# Patient Record
Sex: Male | Born: 1995 | Race: Black or African American | Hispanic: No | Marital: Single | Smoking: Current every day smoker
Health system: Southern US, Community
[De-identification: ages and names within clinical notes are randomized; demographics above are authoritative.]

---
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
## 2016-02-24 ENCOUNTER — Encounter: Payer: Self-pay | Admitting: Emergency Medicine

## 2016-02-24 ENCOUNTER — Emergency Department
Admission: EM | Admit: 2016-02-24 | Discharge: 2016-02-24 | Disposition: A | Discharge status: Home / Self Care | Payer: Medicaid Other | Attending: Emergency Medicine | Admitting: Emergency Medicine

## 2016-02-24 DIAGNOSIS — Y939 Activity, unspecified: Secondary | ICD-10-CM | POA: Diagnosis not present

## 2016-02-24 DIAGNOSIS — T162XXA Foreign body in left ear, initial encounter: Secondary | ICD-10-CM | POA: Diagnosis present

## 2016-02-24 DIAGNOSIS — X58XXXA Exposure to other specified factors, initial encounter: Secondary | ICD-10-CM | POA: Diagnosis not present

## 2016-02-24 DIAGNOSIS — Y929 Unspecified place or not applicable: Secondary | ICD-10-CM | POA: Diagnosis not present

## 2016-02-24 DIAGNOSIS — F17210 Nicotine dependence, cigarettes, uncomplicated: Secondary | ICD-10-CM | POA: Insufficient documentation

## 2016-02-24 DIAGNOSIS — Z79899 Other long term (current) drug therapy: Secondary | ICD-10-CM | POA: Diagnosis not present

## 2016-02-24 DIAGNOSIS — Y999 Unspecified external cause status: Secondary | ICD-10-CM | POA: Insufficient documentation

## 2016-02-24 MED ORDER — LIDOCAINE HCL (PF) 1 % IJ SOLN
INTRAMUSCULAR | Status: AC
  Filled 2016-02-24: qty 5

## 2016-02-24 NOTE — ED Notes (Signed)
See triage note  States he feels like something is fluttering in left ear  No pain

## 2016-02-24 NOTE — ED Triage Notes (Signed)
Pt states he woke up this morning with feelings of a bug in his left ear.  Pt states he still feels it moving around.

## 2016-02-24 NOTE — ED Provider Notes (Signed)
[facility] Emergency Department Provider Note   ____________________________________________   First MD Initiated Contact with Patient 02/24/16 1304     (approximate)  I have reviewed the triage vital signs and the nursing notes.   HISTORY  Chief Complaint Foreign Body in Ear    HPI Christopher Morgan is a 20 y.o. male patient were just wants a refill no blood in his left ear. Patient states still feel that bug moving around in his ear. Patient is very apprehensive about this complaint. Patient denies any pain with this complaint. No palliative measures for this complaint.   History reviewed. No pertinent past medical history.  There are no active problems to display for this patient.   History reviewed. No pertinent surgical history.  Prior to Admission medications   Medication Sig Start Date End Date Taking? Authorizing Provider  amoxicillin (AMOXIL) 500 MG tablet Take 1 tablet (500 mg total) by mouth 2 (two) times daily. 06/11/15   Charmayne Sheerharles M Beers, PA-C  amoxicillin-clavulanate (AUGMENTIN) 875-125 MG tablet Take 1 tablet by mouth 2 (two) times daily. 06/22/15   Chinita Pesterari B Triplett, FNP  chlorpheniramine-HYDROcodone (TUSSIONEX PENNKINETIC ER) 10-8 MG/5ML SUER Take 5 mLs by mouth 2 (two) times daily. 10/29/15   Irean HongJade J Sung, MD  ibuprofen (ADVIL,MOTRIN) 600 MG tablet Take 1 tablet (600 mg total) by mouth every 8 (eight) hours as needed. 10/29/15   Irean HongJade J Sung, MD  lidocaine (XYLOCAINE) 2 % solution Use as directed 20 mLs in the mouth or throat as needed for mouth pain. 06/11/15   Evangeline Dakinharles M Beers, PA-C    Allergies Review of patient's allergies indicates no known allergies.  History reviewed. No pertinent family history.  Social History Social History  Substance Use Topics  . Smoking status: Current Every Day Smoker    Packs/day: 0.03    Types: Cigarettes  . Smokeless tobacco: Never Used  . Alcohol use No    Review of Systems Constitutional: No  fever/chills Eyes: No visual changes. ENT: No sore throat. Insect in left ear. Cardiovascular: Denies chest pain. Respiratory: Denies shortness of breath. Gastrointestinal: No abdominal pain.  No nausea, no vomiting.  No diarrhea.  No constipation. Genitourinary: Negative for dysuria. Musculoskeletal: Negative for back pain. Skin: Negative for rash. Neurological: Negative for headaches, focal weakness or numbness.    ____________________________________________   PHYSICAL EXAM:  VITAL SIGNS: ED Triage Vitals [02/24/16 1247]  Enc Vitals Group     BP 128/65     Pulse Rate 76     Resp 18     Temp 97.9 F (36.6 C)     Temp Source Oral     SpO2 100 %     Weight 146 lb (66.2 kg)     Height 5\' 8"  (1.727 m)     Head Circumference      Peak Flow      Pain Score 0     Pain Loc      Pain Edu?      Excl. in GC?     Constitutional: Alert and oriented. Well appearing and in no acute distress. Eyes: Conjunctivae are normal. PERRL. EOMI. Head: Atraumatic. Nose: No congestion/rhinnorhea. EAR: Visible mobile insect in left ear. Mouth/Throat: Mucous membranes are moist.  Oropharynx non-erythematous. Neck: No stridor.  No cervical spine tenderness to palpation. Hematological/Lymphatic/Immunilogical: No cervical lymphadenopathy. Cardiovascular: Normal rate, regular rhythm. Grossly normal heart sounds.  Good peripheral circulation. Respiratory: Normal respiratory effort.  No retractions. Lungs CTAB. Gastrointestinal: Soft and nontender.  No distention. No abdominal bruits. No CVA tenderness. Musculoskeletal: No lower extremity tenderness nor edema.  No joint effusions. Neurologic:  Normal speech and language. No gross focal neurologic deficits are appreciated. No gait instability. Skin:  Skin is warm, dry and intact. No rash noted. Psychiatric: Mood and affect are normal. Speech and behavior are normal.  ____________________________________________   LABS (all labs ordered are  listed, but only abnormal results are displayed)  Labs Reviewed - No data to display ____________________________________________  EKG   ____________________________________________  RADIOLOGY   ____________________________________________   PROCEDURES  Procedure(s) performed: None  Procedures  Critical Care performed: No  ____________________________________________   INITIAL IMPRESSION / ASSESSMENT AND PLAN / ED COURSE  Pertinent labs & imaging results that were available during my care of the patient were reviewed by me and considered in my medical decision making (see chart for details).  Insect left ear. Status post instilling 1% lidocaine in the ear insect with without complications. Patient given discharge care instructions.  Clinical Course     ____________________________________________   FINAL CLINICAL IMPRESSION(S) / ED DIAGNOSES  Final diagnoses:  Foreign body in auricle of left ear, initial encounter      NEW MEDICATIONS STARTED DURING THIS VISIT:  New Prescriptions   No medications on file     Note:  This document was prepared using Dragon voice recognition software and may include unintentional dictation errors.    Ronald KJoni ReiningC 02/24/16 1339    Jene Every, MD 02/24/16 [phone]

## 2016-04-08 ENCOUNTER — Emergency Department
Admission: EM | Admit: 2016-04-08 | Discharge: 2016-04-08 | Disposition: A | Discharge status: Home / Self Care | Payer: Medicaid Other | Attending: Emergency Medicine | Admitting: Emergency Medicine

## 2016-04-08 ENCOUNTER — Encounter: Payer: Self-pay | Admitting: Emergency Medicine

## 2016-04-08 DIAGNOSIS — J029 Acute pharyngitis, unspecified: Secondary | ICD-10-CM | POA: Insufficient documentation

## 2016-04-08 DIAGNOSIS — F17210 Nicotine dependence, cigarettes, uncomplicated: Secondary | ICD-10-CM | POA: Diagnosis not present

## 2016-04-08 LAB — POCT RAPID STREP A: Streptococcus, Group A Screen (Direct): NEGATIVE

## 2016-04-08 MED ORDER — AMOXICILLIN 500 MG PO TABS: 500.0000 mg | ORAL_TABLET | Freq: Two times a day (BID) | ORAL | 0 refills | Status: DC

## 2016-04-08 NOTE — ED Provider Notes (Signed)
[facility] Emergency Department Provider Note   ____________________________________________   None    (approximate)  I have reviewed the triage vital signs and the nursing notes.   HISTORY  Chief Complaint Sore Throat    HPI Christopher Morgan is a 20 y.o. male 's with 2 day history of sore throat headache. Patient states past medical history significant for recurrent strep.   History reviewed. No pertinent past medical history.  There are no active problems to display for this patient.   History reviewed. No pertinent surgical history.  Prior to Admission medications   Medication Sig Start Date End Date Taking? Authorizing Provider  amoxicillin (AMOXIL) 500 MG tablet Take 1 tablet (500 mg total) by mouth 2 (two) times daily. 04/08/16   Charmayne Sheerharles M Beers, PA-C  lidocaine (XYLOCAINE) 2 % solution Use as directed 20 mLs in the mouth or throat as needed for mouth pain. 06/11/15   Evangeline Dakinharles M Beers, PA-C    Allergies Review of patient's allergies indicates no known allergies.  History reviewed. No pertinent family history.  Social History Social History  Substance Use Topics  . Smoking status: Current Every Day Smoker    Packs/day: 0.03    Types: Cigarettes  . Smokeless tobacco: Never Used  . Alcohol use No    Review of Systems Constitutional: No fever/chills ENT: Positive sore throat Cardiovascular: Denies chest pain. Respiratory: Denies shortness of breath. Musculoskeletal: Negative for back pain. Skin: Negative for rash. Neurological: Negative for headaches, focal weakness or numbness.  10-point ROS otherwise negative.  ____________________________________________   PHYSICAL EXAM:  VITAL SIGNS: ED Triage Vitals  Enc Vitals Group     BP 04/08/16 1737 127/76     Pulse Rate 04/08/16 1737 96     Resp 04/08/16 1737 16     Temp 04/08/16 1737 98.3 F (36.8 C)     Temp Source 04/08/16 1737 Oral     SpO2 04/08/16 1737 98 %     Weight  04/08/16 1738 145 lb (65.8 kg)     Height --      Head Circumference --      Peak Flow --      Pain Score 04/08/16 1738 2     Pain Loc --      Pain Edu? --      Excl. in GC? --     Constitutional: Alert and oriented. Well appearing and in no acute distress. Nose: No congestion/rhinnorhea. Mouth/Throat: Mucous membranes are moist.  Oropharynx Slightly erythematous. No exudate noted. Neck: No stridor.Positive cervical adenopathy.   Cardiovascular: Normal rate, regular rhythm. Grossly normal heart sounds.  Good peripheral circulation. Respiratory: Normal respiratory effort.  No retractions. Lungs CTAB.Marland Kitchen. Neurologic:  Normal speech and language. No gross focal neurologic deficits are appreciated. No gait instability. Skin:  Skin is warm, dry and intact. No rash noted. Psychiatric: Mood and affect are normal. Speech and behavior are normal.  ____________________________________________   LABS (all labs ordered are listed, but only abnormal results are displayed)  Labs Reviewed  CULTURE, GROUP A STREP [facility])  POCT RAPID STREP A   ____________________________________________  EKG   ____________________________________________  RADIOLOGY   ____________________________________________   PROCEDURES  Procedure(s) performed: None  Procedures  Critical Care performed: No  ____________________________________________   INITIAL IMPRESSION / ASSESSMENT AND PLAN / ED COURSE  Pertinent labs & imaging results that were available during my care of the patient were reviewed by me and considered in my medical decision making (see chart for  details).  Pharyngitis. Rx given for Amoxil 500 mg 3 times a day. Patient follow-up PCP or return to ER with any worsening symptomology.  Clinical Course     ____________________________________________   FINAL CLINICAL IMPRESSION(S) / ED DIAGNOSES  Final diagnoses:  Sore throat      NEW MEDICATIONS STARTED DURING THIS  VISIT:  New Prescriptions   AMOXICILLIN (AMOXIL) 500 MG TABLET    Take 1 tablet (500 mg total) by mouth 2 (two) times daily.     Note:  This document was prepared using Dragon voice recognition software and may include unintentional dictation errors.   Evangeline Dakin, PA-C 04/08/16 1828    Rockne Menghini, MD 04/08/16 1929

## 2016-04-08 NOTE — ED Triage Notes (Signed)
Pt c/o sore throat X 3 days. Also reports spots on tongue

## 2016-04-11 LAB — CULTURE, GROUP A STREP (THRC)

## 2016-06-24 ENCOUNTER — Encounter: Payer: Self-pay | Admitting: Emergency Medicine

## 2016-06-24 ENCOUNTER — Emergency Department
Admission: EM | Admit: 2016-06-24 | Discharge: 2016-06-24 | Disposition: A | Discharge status: Home / Self Care | Payer: Medicaid Other | Attending: Emergency Medicine | Admitting: Emergency Medicine

## 2016-06-24 DIAGNOSIS — F17210 Nicotine dependence, cigarettes, uncomplicated: Secondary | ICD-10-CM | POA: Insufficient documentation

## 2016-06-24 DIAGNOSIS — B9689 Other specified bacterial agents as the cause of diseases classified elsewhere: Secondary | ICD-10-CM

## 2016-06-24 DIAGNOSIS — J988 Other specified respiratory disorders: Secondary | ICD-10-CM

## 2016-06-24 DIAGNOSIS — R05 Cough: Secondary | ICD-10-CM | POA: Diagnosis present

## 2016-06-24 MED ORDER — AZITHROMYCIN 250 MG PO TABS: ORAL_TABLET | ORAL | 0 refills | Status: DC

## 2016-06-24 MED ORDER — PSEUDOEPH-BROMPHEN-DM 30-2-10 MG/5ML PO SYRP: 10.0000 mL | ORAL_SOLUTION | Freq: Four times a day (QID) | ORAL | 0 refills | Status: DC | PRN

## 2016-06-24 NOTE — ED Provider Notes (Signed)
[facility] Emergency Department Provider Note  ____________________________________________  Time seen: Approximately 12:06 PM  I have reviewed the triage vital signs and the nursing notes.   HISTORY  Chief Complaint Cough    HPI Christopher Morgan is a 20 y.o. male who presents emergency department complaining of scratchy throat and cough. Patient states that he started with cold-like symptoms 8-10 days ago. Patient states that he has had a cough in addition to some nasal congestion and sore throat. Patient states that symptoms started to improve and then worsened over the past several days. He is tried Tylenol, Motrin, cough drops with mild relief. 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 Patient denies any fevers or chills, headache, visual changes, neck pain, chest pain, shortness of breath, bowel pain, nausea or vomiting.   History reviewed. No pertinent past medical history.  There are no active problems to display for this patient.   History reviewed. No pertinent surgical history.  Prior to Admission medications   Medication Sig Start Date End Date Taking? Authorizing Provider  azithromycin (ZITHROMAX Z-PAK) 250 MG tablet Take 2 tablets (500 mg) on  Day 1,  followed by 1 tablet (250 mg) once daily on Days 2 through 5. 06/24/16   Christiane HaJonathan D Cuthriell, PA-C  brompheniramine-pseudoephedrine-DM 30-2-10 MG/5ML syrup Take 10 mLs by mouth 4 (four) times daily as needed. 06/24/16   Delorise RoyalsJonathan D Cuthriell, PA-C    Allergies Patient has no known allergies.  No family history on file.  Social History Social History  Substance Use Topics  . Smoking status: Current Every Day Smoker    Packs/day: 0.03    Types: Cigarettes  . Smokeless tobacco: Never Used  . Alcohol use No     Review of Systems  Constitutional: No fever/chills Eyes: No visual changes. No discharge ENT: Positive for scratchy throat Cardiovascular: no chest  pain. Respiratory: Positive for nonproductive cough. No SOB. Gastrointestinal: No abdominal pain.  No nausea, no vomiting.  Musculoskeletal: Negative for musculoskeletal pain. Skin: Negative for rash, abrasions, lacerations, ecchymosis. Neurological: Negative for headaches, focal weakness or numbness. 10-point ROS otherwise negative.  ____________________________________________   PHYSICAL EXAM:  VITAL SIGNS: ED Triage Vitals  Enc Vitals Group     BP 06/24/16 1136 123/72     Pulse Rate 06/24/16 1136 75     Resp 06/24/16 1136 18     Temp 06/24/16 1136 98.4 F (36.9 C)     Temp Source 06/24/16 1136 Oral     SpO2 06/24/16 1136 100 %     Weight 06/24/16 1137 146 lb (66.2 kg)     Height 06/24/16 1137 5\' 7"  (1.702 m)     Head Circumference --      Peak Flow --      Pain Score --      Pain Loc --      Pain Edu? --      Excl. in GC? --      Constitutional: Alert and oriented. Well appearing and in no acute distress. Eyes: Conjunctivae are normal. PERRL. EOMI. Head: Atraumatic. ENT:      Ears: EACs and TMs are unremarkable bilaterally.      Nose: No congestion/rhinnorhea.      Mouth/Throat: Mucous membranes are moist. Oropharynx is mildly erythematous but nonedematous. Uvula is midline. Tonsils are unremarkable bilaterally. Neck: No stridor.   Hematological/Lymphatic/Immunilogical: No cervical lymphadenopathy. Cardiovascular: Normal rate, regular rhythm. Normal S1 and S2.  Good peripheral circulation. Respiratory: Normal respiratory  effort without tachypnea or retractions. Lungs CTAB. Good air entry to the bases with no decreased or absent breath sounds. Musculoskeletal: Full range of motion to all extremities. No gross deformities appreciated. Neurologic:  Normal speech and language. No gross focal neurologic deficits are appreciated.  Skin:  Skin is warm, dry and intact. No rash noted. Psychiatric: Mood and affect are normal. Speech and behavior are normal. Patient exhibits  appropriate insight and judgement.   ____________________________________________   LABS (all labs ordered are listed, but only abnormal results are displayed)  Labs Reviewed - No data to display ____________________________________________  EKG   ____________________________________________  RADIOLOGY   No results found.  ____________________________________________    PROCEDURES  Procedure(s) performed:    Procedures    Medications - No data to display   ____________________________________________   INITIAL IMPRESSION / ASSESSMENT AND PLAN / ED COURSE  Pertinent labs & imaging results that were available during my care of the patient were reviewed by me and considered in my medical decision making (see chart for details).  Review of the [place] CSRS was performed in accordance of the [facility] prior to dispensing any controlled drugs.  Clinical Course     Patient's diagnosis is consistent with Extra respiratory infection. Patient's initial symptoms consistent with a viral respiratory infection but his symptoms started to improve and then worsened consistent with onset of bacterial infection.Exam is reassuring and there is no indication for labs or imaging at this time. As such, patient will be given cough medication and a prescription for Zithromax.. Patient will follow-up with primary care as needed. Patient is given ED precautions to return to the ED for any worsening or new symptoms.     ____________________________________________  FINAL CLINICAL IMPRESSION(S) / ED DIAGNOSES  Final diagnoses:  Bacterial respiratory infection      NEW MEDICATIONS STARTED DURING THIS VISIT:  New Prescriptions   AZITHROMYCIN (ZITHROMAX Z-PAK) 250 MG TABLET    Take 2 tablets (500 mg) on  Day 1,  followed by 1 tablet (250 mg) once daily on Days 2 through 5.   BROMPHENIRAMINE-PSEUDOEPHEDRINE-DM 30-2-10 MG/5ML SYRUP    Take 10 mLs by mouth 4 (four) times daily as needed.         This chart was dictated using voice recognition software/Dragon. Despite best efforts to proofread, errors can occur which can change the meaning. Any change was purely unintentional.    Racheal PatchesJonathan D Cuthriell, PA-C 06/24/16 1215    Governor Rooksebecca Lord, MD 06/24/16 1308

## 2016-06-24 NOTE — ED Triage Notes (Signed)
Presents with hx of cough for about 1 week  States sx's became better  Then cough returned with sl sore throat  Cough is non prod

## 2017-03-19 ENCOUNTER — Encounter: Payer: Self-pay | Admitting: Emergency Medicine

## 2017-03-19 ENCOUNTER — Emergency Department
Admission: EM | Admit: 2017-03-19 | Discharge: 2017-03-19 | Disposition: A | Discharge status: Home / Self Care | Payer: Medicaid Other | Attending: Emergency Medicine | Admitting: Emergency Medicine

## 2017-03-19 DIAGNOSIS — Y999 Unspecified external cause status: Secondary | ICD-10-CM | POA: Insufficient documentation

## 2017-03-19 DIAGNOSIS — F17210 Nicotine dependence, cigarettes, uncomplicated: Secondary | ICD-10-CM | POA: Diagnosis not present

## 2017-03-19 DIAGNOSIS — Y939 Activity, unspecified: Secondary | ICD-10-CM | POA: Diagnosis not present

## 2017-03-19 DIAGNOSIS — Y929 Unspecified place or not applicable: Secondary | ICD-10-CM | POA: Diagnosis not present

## 2017-03-19 DIAGNOSIS — S20211A Contusion of right front wall of thorax, initial encounter: Secondary | ICD-10-CM | POA: Diagnosis not present

## 2017-03-19 DIAGNOSIS — X58XXXA Exposure to other specified factors, initial encounter: Secondary | ICD-10-CM | POA: Insufficient documentation

## 2017-03-19 DIAGNOSIS — S299XXA Unspecified injury of thorax, initial encounter: Secondary | ICD-10-CM | POA: Diagnosis present

## 2017-03-19 MED ORDER — TRAMADOL HCL 50 MG PO TABS: 50.0000 mg | ORAL_TABLET | Freq: Four times a day (QID) | ORAL | 0 refills | Status: DC | PRN

## 2017-03-19 MED ORDER — IBUPROFEN 600 MG PO TABS: 600.0000 mg | ORAL_TABLET | Freq: Three times a day (TID) | ORAL | 0 refills | Status: DC | PRN
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
## 2017-11-15 ENCOUNTER — Other Ambulatory Visit: Payer: Self-pay

## 2017-11-15 ENCOUNTER — Encounter: Payer: Self-pay | Admitting: Physician Assistant

## 2017-11-15 ENCOUNTER — Emergency Department
Admission: EM | Admit: 2017-11-15 | Discharge: 2017-11-15 | Disposition: A | Discharge status: Home / Self Care | Payer: Medicaid Other | Attending: Emergency Medicine | Admitting: Emergency Medicine

## 2017-11-15 DIAGNOSIS — Z711 Person with feared health complaint in whom no diagnosis is made: Secondary | ICD-10-CM

## 2017-11-15 DIAGNOSIS — F17210 Nicotine dependence, cigarettes, uncomplicated: Secondary | ICD-10-CM | POA: Insufficient documentation

## 2017-11-15 DIAGNOSIS — Z202 Contact with and (suspected) exposure to infections with a predominantly sexual mode of transmission: Secondary | ICD-10-CM | POA: Insufficient documentation

## 2017-11-15 DIAGNOSIS — Z113 Encounter for screening for infections with a predominantly sexual mode of transmission: Secondary | ICD-10-CM

## 2017-11-15 LAB — URINALYSIS, COMPLETE (UACMP) WITH MICROSCOPIC
Bacteria, UA: NONE SEEN
Bilirubin Urine: NEGATIVE
Glucose, UA: NEGATIVE mg/dL
Hgb urine dipstick: NEGATIVE
Ketones, ur: NEGATIVE mg/dL
Leukocytes, UA: NEGATIVE
Nitrite: NEGATIVE
Protein, ur: NEGATIVE mg/dL
Specific Gravity, Urine: 1.023 (ref 1.005–1.030)
pH: 6 (ref 5.0–8.0)

## 2017-11-15 LAB — CHLAMYDIA/NGC RT PCR (ARMC ONLY)
Chlamydia Tr: NOT DETECTED
N gonorrhoeae: NOT DETECTED
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
## 2017-11-15 NOTE — ED Provider Notes (Signed)
[facility] Emergency Department Provider Note ____________________________________________  Time seen: 1614  I have reviewed the triage vital signs and the nursing notes.  HISTORY  Chief Complaint  Exposure to STD  HPI Christopher Morgan is a 22 y.o. male presents to the ED requesting STD exposure testing.  Patient denies any current dysuria, retention, penile discharge, penile lesions.  He reports that his male partner of five months, apparently cheated on him and he is concerned for STD exposure. The patient has never been tested for HIV/RPR.  History reviewed. No pertinent past medical history.  There are no active problems to display for this patient.  History reviewed. No pertinent surgical history.  Prior to Admission medications   Medication Sig Start Date End Date Taking? Authorizing Provider  azithromycin (ZITHROMAX Z-PAK) 250 MG tablet Take 2 tablets (500 mg) on  Day 1,  followed by 1 tablet (250 mg) once daily on Days 2 through 5. 06/24/16   Cuthriell, Christiane Ha D, PA-C  brompheniramine-pseudoephedrine-DM 30-2-10 MG/5ML syrup Take 10 mLs by mouth 4 (four) times daily as needed. 06/24/16   Cuthriell, Delorise Royals, PA-C  ibuprofen (ADVIL,MOTRIN) 600 MG tablet Take 1 tablet (600 mg total) by mouth every 8 (eight) hours as needed. 03/19/17   Joni Reining, PA-C  traMADol (ULTRAM) 50 MG tablet Take 1 tablet (50 mg total) by mouth every 6 (six) hours as needed for moderate pain. 03/19/17   Joni Reining, PA-C    Allergies Patient has no known allergies.  No family history on file.  Social History Social History   Tobacco Use  . Smoking status: Current Every Day Smoker    Packs/day: 0.03    Types: Cigarettes  . Smokeless tobacco: Never Used  Substance Use Topics  . Alcohol use: No  . Drug use: No    Review of Systems  Constitutional: Negative for fever. Eyes: Negative for visual changes. ENT: Negative for sore throat. Cardiovascular: Negative for  chest pain. Respiratory: Negative for shortness of breath. Gastrointestinal: Negative for abdominal pain, vomiting and diarrhea. Genitourinary: Negative for dysuria, penile discharge, or lesions. Musculoskeletal: Negative for back pain. Skin: Negative for rash. Neurological: Negative for headaches, focal weakness or numbness. ____________________________________________  PHYSICAL EXAM:  VITAL SIGNS: ED Triage Vitals [11/15/17 1608]  Enc Vitals Group     BP 113/71     Pulse Rate (!) 104     Resp 18     Temp 98.2 F (36.8 C)     Temp Source Oral     SpO2 98 %     Weight 135 lb (61.2 kg)     Height  (1.702 m)     Head Circumference      Peak Flow      Pain Score 0     Pain Loc      Pain Edu?      Excl. in GC?     Constitutional: Alert and oriented. Well appearing and in no distress. Head: Normocephalic and atraumatic. Eyes: Conjunctivae are normal. PERRL. Normal extraocular movements Cardiovascular: Normal rate, regular rhythm. Normal distal pulses. Respiratory: Normal respiratory effort. No wheezes/rales/rhonchi. Gastrointestinal: Soft and nontender. No distention. GU: deferred Musculoskeletal: Nontender with normal range of motion in all extremities.  Neurologic:  Normal gait without ataxia. Normal speech and language. No gross focal neurologic deficits are appreciated. Skin:  Skin is warm, dry and intact. No rash noted. Psychiatric: Mood and affect are normal. Patient exhibits appropriate insight and judgment. ____________________________________________   LABS (  pertinent positives/negatives)  Labs Reviewed  URINALYSIS, COMPLETE (UACMP) WITH MICROSCOPIC - Abnormal; Notable for the following components:      Result Value   Color, Urine YELLOW (*)    APPearance HAZY (*)    All other components within normal limits  CHLAMYDIA/NGC RT PCR ([facility] ONLY)  RPR  HIV ANTIBODY (ROUTINE TESTING)   ____________________________________________  PROCEDURES  Procedures ____________________________________________  INITIAL IMPRESSION / ASSESSMENT AND PLAN / ED COURSE  Patient with a history of MSM, presents for STD exposure testing. He is without symptoms at this time. We discussed the need for repeat testing in 3 months. He is referred to the local health department for ongoing care and medical care. He is notified of negative GC culture. His HIV & RPR tests are pending at this time. Questions are encouraged and answered to his satisfaction.  ____________________________________________  FINAL CLINICAL IMPRESSION(S) / ED DIAGNOSES  Final diagnoses:  Screen for STD (sexually transmitted disease)  Concern about STD in male without diagnosis      Karmen Stabs, Charlesetta Ivory, PA-C 11/15/17 2007    Emily Filbert, MD 11/15/17 2258

## 2017-11-15 NOTE — ED Triage Notes (Signed)
Pt alert, oriented, ambulatory. States significant other cheated on him and he wants to get STD tested. No distress noted. Denies urinary symptoms, denies penile discharge.

## 2017-11-15 NOTE — Discharge Instructions (Addendum)
You test for gonorrhea and chlamydia are negative. You remaining blood test will result in 1 week. You will be notified by phone of any positive results. You should follow-up with the health department for further testing and routine medical care. Consider the HPV vaccine if you have not already received it. There is no indication for treatment of any presumed infection at this time.

## 2017-11-18 LAB — RPR, QUANT+TP ABS (REFLEX)
Rapid Plasma Reagin, Quant: 1:16 {titer} — ABNORMAL HIGH
T Pallidum Abs: POSITIVE — AB

## 2017-11-18 LAB — HIV ANTIBODY (ROUTINE TESTING W REFLEX): HIV Screen 4th Generation wRfx: REACTIVE — AB

## 2017-11-18 LAB — HIV 1/2 AB DIFFERENTIATION
HIV 1 Ab: POSITIVE — AB
HIV 2 Ab: NEGATIVE

## 2017-11-18 LAB — RPR: RPR Ser Ql: REACTIVE — AB

## 2017-11-19 ENCOUNTER — Telehealth: Payer: Self-pay | Admitting: Emergency Medicine

## 2017-11-19 ENCOUNTER — Encounter: Payer: Self-pay | Admitting: Infectious Diseases

## 2017-11-19 NOTE — Telephone Encounter (Signed)
Called patient to discuss results of rpr, hiv testing.  He needs treatment for positive rpr/t palidum--which he can receive at his pcp or urgent care or [facility] STD clinic or here.  He also needs to get into treatment for HIV.  Resources for HIV care options would be his pcp (if he has one,) [facility], [facility].  I was unable to reach the patient by phone, as it does not go through.  The number listed for his mother is wrong number.  So I will send him a letter.

## 2017-11-20 ENCOUNTER — Telehealth: Payer: Self-pay | Admitting: Infectious Diseases

## 2017-11-20 NOTE — Telephone Encounter (Signed)
Spoke with Christopher Morgan with [facility] and she is aware of + HIV and + RPR> I tried calling patient multiple times but number busy. I will try  to  Keep trying and so will Tiara. Will get into see [facility] next week.

## 2018-05-22 ENCOUNTER — Emergency Department
Admission: EM | Admit: 2018-05-22 | Discharge: 2018-05-22 | Disposition: A | Discharge status: Home / Self Care | Payer: Self-pay | Attending: Emergency Medicine | Admitting: Emergency Medicine

## 2018-05-22 ENCOUNTER — Emergency Department: Payer: Self-pay

## 2018-05-22 ENCOUNTER — Other Ambulatory Visit: Payer: Self-pay

## 2018-05-22 DIAGNOSIS — T148XXA Other injury of unspecified body region, initial encounter: Secondary | ICD-10-CM | POA: Insufficient documentation

## 2018-05-22 DIAGNOSIS — Y92481 Parking lot as the place of occurrence of the external cause: Secondary | ICD-10-CM | POA: Insufficient documentation

## 2018-05-22 DIAGNOSIS — Y9389 Activity, other specified: Secondary | ICD-10-CM | POA: Insufficient documentation

## 2018-05-22 DIAGNOSIS — S0990XA Unspecified injury of head, initial encounter: Secondary | ICD-10-CM | POA: Insufficient documentation

## 2018-05-22 DIAGNOSIS — S0232XA Fracture of orbital floor, left side, initial encounter for closed fracture: Secondary | ICD-10-CM | POA: Insufficient documentation

## 2018-05-22 DIAGNOSIS — Y998 Other external cause status: Secondary | ICD-10-CM | POA: Insufficient documentation

## 2018-05-22 DIAGNOSIS — F17210 Nicotine dependence, cigarettes, uncomplicated: Secondary | ICD-10-CM | POA: Insufficient documentation

## 2018-05-22 MED ORDER — CEPHALEXIN 500 MG PO CAPS: 500.0000 mg | ORAL_CAPSULE | Freq: Three times a day (TID) | ORAL | 0 refills | Status: DC

## 2018-05-22 MED ORDER — HYDROCODONE-ACETAMINOPHEN 5-325 MG PO TABS: 1.0000 | ORAL_TABLET | Freq: Four times a day (QID) | ORAL | 0 refills | Status: DC | PRN

## 2018-05-22 NOTE — ED Notes (Signed)
05/22/18 1800 multiple attempts made to contact pt regarding additional prescription left for pt, unable to get through to pt's phone
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
## 2018-05-22 NOTE — ED Notes (Signed)
Pt stated that he has not been drinking alcohol or doing any types of drugs tonight.
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
## 2018-05-22 NOTE — Discharge Instructions (Addendum)
You may take Norco (#15) as needed for pain.   Apply ice to affected area several times daily to reduce swelling.   Return to the ER for worsening symptoms, persistent vomiting, lethargy or other concerns.
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
## 2020-05-16 ENCOUNTER — Other Ambulatory Visit: Payer: Self-pay

## 2020-05-16 ENCOUNTER — Emergency Department
Admission: EM | Admit: 2020-05-16 | Discharge: 2020-05-17 | Disposition: A | Discharge status: Home / Self Care | Payer: Self-pay | Attending: Emergency Medicine | Admitting: Emergency Medicine

## 2020-05-16 DIAGNOSIS — F29 Unspecified psychosis not due to a substance or known physiological condition: Secondary | ICD-10-CM | POA: Diagnosis not present

## 2020-05-16 DIAGNOSIS — F23 Brief psychotic disorder: Secondary | ICD-10-CM

## 2020-05-16 DIAGNOSIS — F17210 Nicotine dependence, cigarettes, uncomplicated: Secondary | ICD-10-CM | POA: Insufficient documentation

## 2020-05-16 DIAGNOSIS — Z21 Asymptomatic human immunodeficiency virus [HIV] infection status: Secondary | ICD-10-CM

## 2020-05-16 DIAGNOSIS — Z20822 Contact with and (suspected) exposure to covid-19: Secondary | ICD-10-CM | POA: Insufficient documentation

## 2020-05-16 LAB — URINE DRUG SCREEN, QUALITATIVE (ARMC ONLY)
Amphetamines, Ur Screen: NOT DETECTED
Barbiturates, Ur Screen: NOT DETECTED
Benzodiazepine, Ur Scrn: POSITIVE — AB
Cannabinoid 50 Ng, Ur ~~LOC~~: POSITIVE — AB
Cocaine Metabolite,Ur ~~LOC~~: NOT DETECTED
MDMA (Ecstasy)Ur Screen: NOT DETECTED
Methadone Scn, Ur: NOT DETECTED
Opiate, Ur Screen: NOT DETECTED
Phencyclidine (PCP) Ur S: NOT DETECTED
Tricyclic, Ur Screen: NOT DETECTED

## 2020-05-16 LAB — COMPREHENSIVE METABOLIC PANEL
ALT: 9 U/L (ref 0–44)
AST: 23 U/L (ref 15–41)
Albumin: 4.1 g/dL (ref 3.5–5.0)
Alkaline Phosphatase: 54 U/L (ref 38–126)
Anion gap: 10 (ref 5–15)
BUN: 14 mg/dL (ref 6–20)
CO2: 24 mmol/L (ref 22–32)
Calcium: 9.3 mg/dL (ref 8.9–10.3)
Chloride: 103 mmol/L (ref 98–111)
Creatinine, Ser: 0.86 mg/dL (ref 0.61–1.24)
GFR, Estimated: >60 mL/min (ref >60)
Glucose, Bld: 93 mg/dL (ref 70–99)
Potassium: 3.8 mmol/L (ref 3.5–5.1)
Sodium: 137 mmol/L (ref 135–145)
Total Bilirubin: 0.9 mg/dL (ref 0.3–1.2)
Total Protein: 7.8 g/dL (ref 6.5–8.1)

## 2020-05-16 LAB — HEPATITIS PANEL, ACUTE
HCV Ab: NONREACTIVE
Hep A IgM: NONREACTIVE
Hep B C IgM: NONREACTIVE
Hepatitis B Surface Ag: NONREACTIVE

## 2020-05-16 LAB — CBC
HCT: 43.9 % (ref 39.0–52.0)
Hemoglobin: 14.9 g/dL (ref 13.0–17.0)
MCH: 29.1 pg (ref 26.0–34.0)
MCHC: 33.9 g/dL (ref 30.0–36.0)
MCV: 85.7 fL (ref 80.0–100.0)
Platelets: 235 10*3/uL (ref 150–400)
RBC: 5.12 MIL/uL (ref 4.22–5.81)
RDW: 11.9 % (ref 11.5–15.5)
WBC: 6.8 10*3/uL (ref 4.0–10.5)
nRBC: 0 % (ref 0.0–0.2)

## 2020-05-16 LAB — TSH: TSH: 0.554 u[IU]/mL (ref 0.350–4.500)

## 2020-05-16 LAB — ETHANOL: Alcohol, Ethyl (B): <10 mg/dL (ref <10)

## 2020-05-16 LAB — RESPIRATORY PANEL BY RT PCR (FLU A&B, COVID)
Influenza A by PCR: NEGATIVE
Influenza B by PCR: NEGATIVE
SARS Coronavirus 2 by RT PCR: NEGATIVE

## 2020-05-16 MED ORDER — DIPHENHYDRAMINE HCL 50 MG/ML IJ SOLN
50.0000 mg | Freq: Once | INTRAMUSCULAR | Status: AC
  Administered 2020-05-16: 50 mg via INTRAMUSCULAR

## 2020-05-16 MED ORDER — DIPHENHYDRAMINE HCL 25 MG PO CAPS
50.0000 mg | ORAL_CAPSULE | Freq: Once | ORAL | Status: AC
  Administered 2020-05-17: 50 mg via ORAL
  Filled 2020-05-16: qty 2

## 2020-05-16 MED ORDER — LORAZEPAM 2 MG PO TABS
2.0000 mg | ORAL_TABLET | Freq: Once | ORAL | Status: AC
  Administered 2020-05-17: 2 mg via ORAL
  Filled 2020-05-16: qty 1

## 2020-05-16 MED ORDER — DIPHENHYDRAMINE HCL 50 MG/ML IJ SOLN
50.0000 mg | Freq: Once | INTRAMUSCULAR | Status: DC
  Filled 2020-05-16: qty 1

## 2020-05-16 MED ORDER — DIPHENHYDRAMINE HCL 50 MG/ML IJ SOLN: 50.0000 mg | Freq: Once | INTRAMUSCULAR | Status: AC

## 2020-05-16 MED ORDER — LORAZEPAM 2 MG/ML IJ SOLN: 2.0000 mg | Freq: Once | INTRAMUSCULAR | Status: AC

## 2020-05-16 MED ORDER — HALOPERIDOL LACTATE 5 MG/ML IJ SOLN
5.0000 mg | Freq: Once | INTRAMUSCULAR | Status: AC
  Administered 2020-05-16: 5 mg via INTRAMUSCULAR
  Filled 2020-05-16: qty 1

## 2020-05-16 MED ORDER — HALOPERIDOL 5 MG PO TABS
5.0000 mg | ORAL_TABLET | Freq: Once | ORAL | Status: AC
  Administered 2020-05-17: 5 mg via ORAL
  Filled 2020-05-16: qty 1

## 2020-05-16 MED ORDER — HALOPERIDOL LACTATE 5 MG/ML IJ SOLN: 5.0000 mg | Freq: Once | INTRAMUSCULAR | Status: AC

## 2020-05-16 MED ORDER — LORAZEPAM 2 MG/ML IJ SOLN
2.0000 mg | Freq: Once | INTRAMUSCULAR | Status: AC
  Administered 2020-05-16: 2 mg via INTRAMUSCULAR
  Filled 2020-05-16: qty 1

## 2020-05-16 NOTE — ED Notes (Signed)
Pt extremely paranoid, not cooperating, yelling.  Believes people are trying to kill him and that he wont wake up tomorrow.  Medications ordered by Dr. Manson Passey and administered.  Unable to complete triage or assessment at this time d/t pt being uncooperative.  Pt chest rise even and unlabored at this time.

## 2020-05-16 NOTE — ED Notes (Signed)
Hourly rounding reveals patient in room. No complaints, stable, in no acute distress. Q15 minute rounds and monitoring via Security Cameras to continue. 

## 2020-05-16 NOTE — ED Notes (Signed)
[facility]  MOVED  TO  [facility]  UNIT   PENDING  CONSULT

## 2020-05-16 NOTE — ED Provider Notes (Signed)
[facility] Emergency Department Provider Note  ____________________________________________   First MD Initiated Contact with Patient 05/16/20 [phone]     (approximate)  I have reviewed the triage vital signs and the nursing notes.  Level 5 caveat history review of system limited secondary to acute psychosis HISTORY  Chief Complaint Mental Health Problem    HPI Christopher Morgan is a 24 y.o. male presents to the emergency department involuntary committed in police custody.  Per involuntary commitment paperwork patient has a history of mental health and has had paranoid delusions.  Patient repetitively stating that people are trying to kill him including emergency department staff members and the police.  Police notified me that the patient's mother stated that he has not slept in "days.  Police states that the patient called 911 to say that people outside of his door were trying to kill him.        No past medical history on file.  There are no problems to display for this patient.   No past surgical history on file.  Prior to Admission medications   Medication Sig Start Date End Date Taking? Authorizing Provider  azithromycin (ZITHROMAX Z-PAK) 250 MG tablet Take 2 tablets (500 mg) on  Day 1,  followed by 1 tablet (250 mg) once daily on Days 2 through 5. 06/24/16   Cuthriell, Christiane Ha D, PA-C  brompheniramine-pseudoephedrine-DM 30-2-10 MG/5ML syrup Take 10 mLs by mouth 4 (four) times daily as needed. 06/24/16   Cuthriell, Delorise Royals, PA-C  cephALEXin (KEFLEX) 500 MG capsule Take 1 capsule (500 mg total) by mouth 3 (three) times daily. 05/22/18   Irean Hong, MD  HYDROcodone-acetaminophen (NORCO) 5-325 MG tablet Take 1 tablet by mouth every 6 (six) hours as needed for moderate pain. 05/22/18   Irean Hong, MD  ibuprofen (ADVIL,MOTRIN) 600 MG tablet Take 1 tablet (600 mg total) by mouth every 8 (eight) hours as needed. 03/19/17   Joni Reining, PA-C  traMADol  (ULTRAM) 50 MG tablet Take 1 tablet (50 mg total) by mouth every 6 (six) hours as needed for moderate pain. 03/19/17   Joni Reining, PA-C    Allergies Patient has no known allergies.  No family history on file.  Social History Social History   Tobacco Use  . Smoking status: Current Every Day Smoker    Packs/day: 0.03    Types: Cigarettes  . Smokeless tobacco: Never Used  Substance Use Topics  . Alcohol use: No  . Drug use: No    Review of Systems Constitutional: No fever/chills Eyes: No visual changes. ENT: No sore throat. Cardiovascular: Denies chest pain. Respiratory: Denies shortness of breath. Gastrointestinal: No abdominal pain.  No nausea, no vomiting.  No diarrhea.  No constipation. Genitourinary: Negative for dysuria. Musculoskeletal: Negative for neck pain.  Negative for back pain. Integumentary: Negative for rash. Neurological: Negative for headaches, focal weakness or numbness. Psychiatry: Paranoid delusions, hallucinations  ____________________________________________   PHYSICAL EXAM:  VITAL SIGNS: ED Triage Vitals [05/16/20 0240]  Enc Vitals Group     BP (!) 154/96     Pulse Rate 62     Resp 18     Temp 98.6 F (37 C)     Temp Source Oral     SpO2 100 %     Weight      Height      Head Circumference      Peak Flow      Pain Score  Pain Loc      Pain Edu?      Excl. in GC?     Constitutional: Agitated pressured speech Eyes: Conjunctivae are normal.  Head: Atraumatic. Mouth/Throat: Patient is wearing a mask. Neck: No stridor.  No meningeal signs.   Cardiovascular: Normal rate, regular rhythm. Good peripheral circulation. Grossly normal heart sounds. Respiratory: Normal respiratory effort.  No retractions. Gastrointestinal: Soft and nontender. No distention.  Musculoskeletal: No lower extremity tenderness nor edema. No gross deformities of extremities. Neurologic: Pressured speech. No gross focal neurologic deficits are appreciated.    Skin:  Skin is warm, dry and intact. Psychiatric: Agitated, pressured speech, paranoid delusions  ____________________________________________   LABS (all labs ordered are listed, but only abnormal results are displayed)  Labs Reviewed  CBC  COMPREHENSIVE METABOLIC PANEL  URINE DRUG SCREEN, QUALITATIVE ([facility] ONLY)  ETHANOL  TSH     Procedures   ____________________________________________   INITIAL IMPRESSION / MDM / ASSESSMENT AND PLAN / ED COURSE  As part of my medical decision making, I reviewed the following data within the electronic MEDICAL RECORD NUMBER  24 year old male presented with above-stated history and physical exam paranoid delusions acute psychosis and agitation.  Given potential risk to staff and patient patient was given Haldol 5 mg IM Benadryl 50 mg IM and Ativan 2 mg IM.  On reevaluation patient resting comfortably with vital signs stable.  Awaiting psychiatry consultation and disposition. ____________________________________________  FINAL CLINICAL IMPRESSION(S) / ED DIAGNOSES  Final diagnoses:  Acute psychosis (HCC)     MEDICATIONS GIVEN DURING THIS VISIT:  Medications  haloperidol lactate (HALDOL) injection 5 mg (5 mg Intramuscular Given by Other 05/16/20 0317)  LORazepam (ATIVAN) injection 2 mg (2 mg Intramuscular Given by Other 05/16/20 0318)  diphenhydrAMINE (BENADRYL) injection 50 mg (50 mg Intramuscular Given by Other 05/16/20 0319)     ED Discharge Orders    None      *Please note:  Christopher Morgan was evaluated in Emergency Department on 05/16/2020 for the symptoms described in the history of present illness. He was evaluated in the context of the global COVID-19 pandemic, which necessitated consideration that the patient might be at risk for infection with the SARS-CoV-2 virus that causes COVID-19. Institutional protocols and algorithms that pertain to the evaluation of patients at risk for COVID-19 are in a state of rapid change based on  information released by regulatory bodies including the CDC and federal and state organizations. These policies and algorithms were followed during the patient's care in the ED.  Some ED evaluations and interventions may be delayed as a result of limited staffing during and after the pandemic.*  Note:  This document was prepared using Dragon voice recognition software and may include unintentional dictation errors.   Darci Current, MD 05/16/20 [phone]

## 2020-05-16 NOTE — ED Notes (Signed)
Pt. Is being dressed out by this tech and 2 officers and 2 security officers. Provided by hospital attire and belongings inside white bag.   Belongings are :   Light blue jeans  Black Jacket  Light blue underwear Black Pair of shoes  White socks  Pink and white flowered tank

## 2020-05-16 NOTE — ED Notes (Signed)
He ambulated to the BR with the specimen cup and he failed to provide a urine sample for the MD

## 2020-05-16 NOTE — ED Notes (Signed)
Lab called for blood tubes, received labs drawn and sent. Urine obtained and sent.

## 2020-05-16 NOTE — ED Notes (Signed)
Snack and beverage given. 

## 2020-05-16 NOTE — Consult Note (Signed)
[facility] Face-to-Face Psychiatry Consult   Reason for Consult: Consult for this 24 year old man brought to the emergency room by law enforcement because of acute paranoia Referring Physician: Scotty Court Patient Identification: Christopher Morgan MRN:  [id number] Principal Diagnosis: Psychosis [facility]) Diagnosis:  Principal Problem:   Psychosis (HCC) Active Problems:   HIV positive (HCC)   Total Time spent with patient: 1 hour  Subjective:   Christopher Morgan is a 24 y.o. Morgan patient admitted with "are you trying to kill me?".  HPI: Patient seen chart reviewed.  Also got to speak briefly with his mother.  50 year old man brought to the emergency room by law enforcement.  Law enforcement reports that the patient himself called 911 stating that people were trying to kill him.  When they got there he was paranoid and resistant towards the law enforcement officers continued to make statements about people being out to get him are trying to kill him.  In the emergency room patient was noted to continue to make paranoid statements and be inappropriate in his thinking when he presented last night.  On interview this morning the patient stated he had no memory of coming to the hospital at all although on further conversation he does have some memory that there were police at his house.  He does not remember the whole chain of events.  He does admit that he has not been sleeping well for a few days.  Denies any mood symptoms.  Denies suicidal or homicidal ideation.  Denies current hallucinations.  He denied any alcohol or drug abuse.  So far we have not been able to get a urine sample so we have no drug screen yet.  Patient is apparently not on any prescription medicine.  He has continued to voice paranoid ideation throughout the day in the emergency room.  Told me several times that he was certain that I was trying to kill him.  He comes across with a an odd, flirtatious labile affect.  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  When I ask him about them he said that he was aware of the positive test but had never gotten any sort of follow-up at all about the HIV.  He says that they gave him "some kind of medicine" for the syphilis but he has no memory of what it was.  Other than that has not had any follow-up on it.  Past Psychiatric History: Mother reports that as a child he had some odd behavior and some school refusal at times.  Patient had no reported memory of it.  No clear diagnosis.  As an adult appears to have had no mental health issues reported  Risk to Self:   Risk to Others:   Prior Inpatient Therapy:   Prior Outpatient Therapy:    Past Medical History: No past medical history on file. No past surgical history on file. Family History: No family history on file. Family Psychiatric  History: None reported Social History:  Social History   Substance and Sexual Activity  Alcohol Use No     Social History   Substance and Sexual Activity  Drug Use No    Social History   Socioeconomic History  . Marital status: Single    Spouse name: Not on file  . Number of children: Not on file  . Years of education: Not on file  . Highest education level: Not on file  Occupational History  . Not on file  Tobacco Use  . Smoking status: Current Every Day Smoker    Packs/day: 0.03    Types: Cigarettes  . Smokeless tobacco: Never Used  Substance and Sexual Activity  . Alcohol use: No  . Drug use: No  . Sexual activity: Not on file  Other Topics Concern  . Not on file  Social History Narrative  . Not on file   Social Determinants of Health   Financial Resource Strain:   . Difficulty of Paying Living Expenses: Not on file  Food Insecurity:   . Worried About Programme researcher, broadcasting/film/videounning Out of Food in the Last Year: Not on file  . Ran Out of Food in the Last Year: Not on file  Transportation Needs:   . Lack of Transportation (Medical): Not  on file  . Lack of Transportation (Non-Medical): Not on file  Physical Activity:   . Days of Exercise per Week: Not on file  . Minutes of Exercise per Session: Not on file  Stress:   . Feeling of Stress : Not on file  Social Connections:   . Frequency of Communication with Friends and Family: Not on file  . Frequency of Social Gatherings with Friends and Family: Not on file  . Attends Religious Services: Not on file  . Active Member of Clubs or Organizations: Not on file  . Attends BankerClub or Organization Meetings: Not on file  . Marital Status: Not on file   Additional Social History:    Allergies:  No Known Allergies  Labs:  Results for orders placed or performed during the hospital encounter of 05/16/20 (from the past 48 hour(s))  CBC     Status: None   Collection Time: 05/16/20  6:11 AM  Result Value Ref Range   WBC 6.8 4.0 - 10.5 K/uL   RBC 5.12 4.22 - 5.81 MIL/uL   Hemoglobin 14.9 13.0 - 17.0 g/dL   HCT 54.043.9 39 - 52 %   MCV 85.7 80.0 - 100.0 fL   MCH 29.1 26.0 - 34.0 pg   MCHC 33.9 30.0 - 36.0 g/dL   RDW 98.111.9 19.111.5 - 47.815.5 %   Platelets 235 150 - 400 K/uL   nRBC 0.0 0.0 - 0.2 %    Comment: Performed at [facility], [address] [place], [place] [id number]  Comprehensive metabolic panel     Status: None   Collection Time: 05/16/20  6:11 AM  Result Value Ref Range   Sodium 137 135 - 145 mmol/L   Potassium 3.8 3.5 - 5.1 mmol/L   Chloride 103 98 - 111 mmol/L   CO2 24 22 - Christopher mmol/L   Glucose, Bld 93 70 - 99 mg/dL    Comment: Glucose reference range applies only to samples taken after fasting for at least 8 hours.   BUN 14 6 - 20 mg/dL   Creatinine, Ser 1.300.86 0.61 - 1.24 mg/dL   Calcium 9.3 8.9 - 86.510.3 mg/dL   Total Protein 7.8 6.5 - 8.1 g/dL   Albumin 4.1 3.5 - 5.0 g/dL   AST 23 15 - 41 U/L   ALT 9 0 - 44 U/L   Alkaline Phosphatase 54 38 - 126 U/L   Total Bilirubin 0.9 0.3 - 1.2 mg/dL   GFR, Estimated >78>60 >46>60 mL/min    Comment: (NOTE) Calculated using  the CKD-EPI Creatinine Equation (2021)    Anion gap 10 5 - 15    Comment: Performed at [facility], [address] [place], [place] [address]  Ethanol     Status: None   Collection Time: 05/16/20  6:11 AM  Result Value Ref Range   Alcohol, Ethyl (B) <10 <10 mg/dL    Comment: (NOTE) Lowest detectable limit for serum alcohol is 10 mg/dL.  For medical purposes only. Performed at [facility], [address] [place], [place] [address]   TSH     Status: None   Collection Time: 05/16/20  6:11 AM  Result Value Ref Range   TSH 0.554 0.350 - 4.500 uIU/mL    Comment: Performed by a 3rd Generation assay with a functional sensitivity of <=0.01 uIU/mL. Performed at [facility], [address] [place], [place] [address]   Respiratory Panel by RT PCR (Flu A&B, Covid) - Nasopharyngeal Swab     Status: None   Collection Time: 05/16/20  8:18 AM   Specimen: Nasopharyngeal Swab  Result Value Ref Range   SARS Coronavirus 2 by RT PCR NEGATIVE NEGATIVE    Comment: (NOTE) SARS-CoV-2 target nucleic acids are NOT DETECTED.  The SARS-CoV-2 RNA is generally detectable in upper respiratoy specimens during the acute phase of infection. The lowest concentration of SARS-CoV-2 viral copies this assay can detect is 131 copies/mL. A negative result does not preclude SARS-Cov-2 infection and should not be used as the sole basis for treatment or other patient management decisions. A negative result may occur with  improper specimen collection/handling, submission of specimen other than nasopharyngeal swab, presence of viral mutation(s) within the areas targeted by this assay, and inadequate number of viral copies (<131 copies/mL). A negative result must be combined with clinical observations, patient history, and epidemiological information. The expected result is Negative.  Fact Sheet for Patients:  https://www.moore.com/  Fact Sheet for Healthcare  Providers:  https://www.young.biz/  This test is no t yet approved or cleared by the [place] FDA and  has been authorized for detection and/or diagnosis of SARS-CoV-2 by FDA under an Emergency Use Authorization (EUA). This EUA will remain  in effect (meaning this test can be used) for the duration of the COVID-19 declaration under Section 564(b)(1) of the Act, 21 U.S.C. section 360bbb-3(b)(1), unless the authorization is terminated or revoked sooner.     Influenza A by PCR NEGATIVE NEGATIVE   Influenza B by PCR NEGATIVE NEGATIVE    Comment: (NOTE) The Xpert Xpress SARS-CoV-2/FLU/RSV assay is intended as an aid in  the diagnosis of influenza from Nasopharyngeal swab specimens and  should not be used as a sole basis for treatment. Nasal washings and  aspirates are unacceptable for Xpert Xpress SARS-CoV-2/FLU/RSV  testing.  Fact Sheet for Patients: https://www.moore.com/  Fact Sheet for Healthcare Providers: https://www.young.biz/  This test is not yet approved or cleared by the [place] FDA and  has been authorized for detection and/or diagnosis of SARS-CoV-2 by  FDA under an Emergency Use Authorization (EUA). This EUA will remain  in effect (meaning this test can be used) for the duration of the  Covid-19 declaration under Section 564(b)(1) of the Act, 21  U.S.C. section 360bbb-3(b)(1), unless the authorization is  terminated or revoked. Performed at [facility], [address] [place], [place] [address]     No current facility-administered medications for this encounter.   Current Outpatient Medications  Medication Sig Dispense Refill  . azithromycin (ZITHROMAX Z-PAK) 250 MG tablet Take 2 tablets (500 mg) on  Day 1,  followed by 1 tablet (250 mg) once daily on Days 2 through  5. (Patient not taking: Reported on 05/16/2020) 6 each 0  . brompheniramine-pseudoephedrine-DM 30-2-10 MG/5ML syrup Take  10 mLs by mouth 4 (four) times daily as needed. (Patient not taking: Reported on 05/16/2020) 200 mL 0  . cephALEXin (KEFLEX) 500 MG capsule Take 1 capsule (500 mg total) by mouth 3 (three) times daily. (Patient not taking: Reported on 05/16/2020) 21 capsule 0  . HYDROcodone-acetaminophen (NORCO) 5-325 MG tablet Take 1 tablet by mouth every 6 (six) hours as needed for moderate pain. (Patient not taking: Reported on 05/16/2020) 15 tablet 0  . ibuprofen (ADVIL,MOTRIN) 600 MG tablet Take 1 tablet (600 mg total) by mouth every 8 (eight) hours as needed. (Patient not taking: Reported on 05/16/2020) 15 tablet 0  . traMADol (ULTRAM) 50 MG tablet Take 1 tablet (50 mg total) by mouth every 6 (six) hours as needed for moderate pain. (Patient not taking: Reported on 05/16/2020) 12 tablet 0    Musculoskeletal: Strength & Muscle Tone: within normal limits Gait & Station: normal Patient leans: N/A  Psychiatric Specialty Exam: Physical Exam Vitals and nursing note reviewed.  Constitutional:      Appearance: He is well-developed.  HENT:     Head: Normocephalic and atraumatic.  Eyes:     Conjunctiva/sclera: Conjunctivae normal.     Pupils: Pupils are equal, round, and reactive to light.  Cardiovascular:     Heart sounds: Normal heart sounds.  Pulmonary:     Effort: Pulmonary effort is normal.  Abdominal:     Palpations: Abdomen is soft.  Musculoskeletal:        General: Normal range of motion.     Cervical back: Normal range of motion.  Skin:    General: Skin is warm and dry.  Neurological:     General: No focal deficit present.     Mental Status: He is alert.  Psychiatric:        Attention and Perception: He is inattentive.        Mood and Affect: Mood is anxious. Affect is inappropriate.        Speech: Speech is tangential.        Behavior: Behavior is agitated. Behavior is not aggressive.        Thought Content: Thought content is paranoid. Thought content does not include homicidal or  suicidal ideation.        Cognition and Memory: Cognition is impaired. Memory is impaired.        Judgment: Judgment is inappropriate.     Review of Systems  Constitutional: Negative.   HENT: Negative.   Eyes: Negative.   Respiratory: Negative.   Cardiovascular: Negative.   Gastrointestinal: Negative.   Musculoskeletal: Negative.   Skin: Negative.   Neurological: Negative.   Psychiatric/Behavioral: Positive for confusion and sleep disturbance. Negative for agitation, behavioral problems, decreased concentration, dysphoric mood, hallucinations, self-injury and suicidal ideas. The patient is not nervous/anxious and is not hyperactive.     Blood pressure 107/67, pulse 69, temperature 98.6 F (37 C), temperature source Oral, resp. rate 16, SpO2 100 %.There is no height or weight on file to calculate BMI.  General Appearance: Disheveled  Eye Contact:  Fair  Speech:  Garbled  Volume:  Normal  Mood:  Anxious, Dysphoric and Irritable  Affect:  Inappropriate and Labile  Thought Process:  Disorganized  Orientation:  Full (Time, Place, and Person)  Thought Content:  Illogical, Delusions, Paranoid Ideation and Tangential  Suicidal Thoughts:  No  Homicidal Thoughts:  No  Memory:  Immediate;  Fair Recent;   Poor Remote;   Fair  Judgement:  Impaired  Insight:  Shallow  Psychomotor Activity:  Restlessness  Concentration:  Concentration: Poor  Recall:  Poor  Fund of Knowledge:  Poor  Language:  Fair  Akathisia:  No  Handed:  Right  AIMS (if indicated):     Assets:  Social Support  ADL's:  Impaired  Cognition:  Impaired,  Mild  Sleep:        Treatment Plan Summary: Daily contact with patient to assess and evaluate symptoms and progress in treatment, Medication management and Plan 24 year old man presents with acute psychosis.  Mother confirms that symptoms were only noticeable in the last 1 day.  Wide differential diagnosis including bipolar disorder, schizophrenia,  substance-induced and the possibility of related to medical conditions.  Even if his symptoms are not related to his medical conditions clearly needs follow-up on the HIV and the RPR test.  Patient will continue under IVC.  He has poor insight and remains psychotic.  Admit to psychiatric ward.  I have already sent a text message to infectious disease to inquire about further work-up and have ordered a repeat of the RPR test.  Disposition: Recommend psychiatric Inpatient admission when medically cleared. Supportive therapy provided about ongoing stressors.  Mordecai Rasmussen, MD 05/16/2020 2:13 PM

## 2020-05-16 NOTE — ED Notes (Signed)
Pt given meal tray with ginger ale  

## 2020-05-16 NOTE — ED Notes (Signed)
Mother to visit patient, visit went well. Dr. Patterson Hammersmith to speak with parent for collateral information and to discuss further plan of care. Patient awaiting admission.

## 2020-05-16 NOTE — ED Notes (Signed)
Report to include Situation, Background, Assessment, and Recommendations received from [facility]. Patient alert and oriented, warm and dry, in no acute distress. Patient denies SI, HI, AVH and pain. Patient appears to be paranoid. Patient made aware of Q15 minute rounds and security cameras for their safety. Patient instructed to come to me with needs or concerns.

## 2020-05-17 ENCOUNTER — Encounter: Payer: Self-pay | Admitting: Psychiatry

## 2020-05-17 ENCOUNTER — Other Ambulatory Visit: Payer: Self-pay

## 2020-05-17 ENCOUNTER — Inpatient Hospital Stay
Admission: AD | Admit: 2020-05-17 | Discharge: 2020-05-29 | DRG: 885 | Disposition: A | Discharge status: Home / Self Care | Payer: No Typology Code available for payment source | Source: Intra-hospital | Attending: Behavioral Health | Admitting: Behavioral Health

## 2020-05-17 DIAGNOSIS — F23 Brief psychotic disorder: Secondary | ICD-10-CM | POA: Diagnosis present

## 2020-05-17 DIAGNOSIS — A539 Syphilis, unspecified: Secondary | ICD-10-CM | POA: Diagnosis present

## 2020-05-17 DIAGNOSIS — F32A Depression, unspecified: Secondary | ICD-10-CM | POA: Diagnosis present

## 2020-05-17 DIAGNOSIS — R45851 Suicidal ideations: Secondary | ICD-10-CM | POA: Diagnosis present

## 2020-05-17 DIAGNOSIS — G4700 Insomnia, unspecified: Secondary | ICD-10-CM | POA: Diagnosis present

## 2020-05-17 DIAGNOSIS — Z79899 Other long term (current) drug therapy: Secondary | ICD-10-CM | POA: Diagnosis not present

## 2020-05-17 DIAGNOSIS — A530 Latent syphilis, unspecified as early or late: Secondary | ICD-10-CM | POA: Diagnosis not present

## 2020-05-17 DIAGNOSIS — F17210 Nicotine dependence, cigarettes, uncomplicated: Secondary | ICD-10-CM | POA: Diagnosis present

## 2020-05-17 DIAGNOSIS — Z23 Encounter for immunization: Secondary | ICD-10-CM

## 2020-05-17 DIAGNOSIS — F22 Delusional disorders: Secondary | ICD-10-CM | POA: Diagnosis present

## 2020-05-17 DIAGNOSIS — Z5181 Encounter for therapeutic drug level monitoring: Secondary | ICD-10-CM

## 2020-05-17 DIAGNOSIS — F29 Unspecified psychosis not due to a substance or known physiological condition: Secondary | ICD-10-CM | POA: Diagnosis not present

## 2020-05-17 DIAGNOSIS — Z8619 Personal history of other infectious and parasitic diseases: Secondary | ICD-10-CM | POA: Diagnosis present

## 2020-05-17 DIAGNOSIS — B20 Human immunodeficiency virus [HIV] disease: Secondary | ICD-10-CM | POA: Diagnosis present

## 2020-05-17 DIAGNOSIS — F259 Schizoaffective disorder, unspecified: Secondary | ICD-10-CM | POA: Diagnosis not present

## 2020-05-17 DIAGNOSIS — Z21 Asymptomatic human immunodeficiency virus [HIV] infection status: Secondary | ICD-10-CM | POA: Diagnosis present

## 2020-05-17 LAB — HELPER T-LYMPH-CD4 (ARMC ONLY)
% CD 4 Pos. Lymph.: 33.4 % (ref 30.8–58.5)
Absolute CD 4 Helper: 635 /uL (ref 359–1519)
Basophils Absolute: 0 10*3/uL (ref 0.0–0.2)
Basos: 1 %
EOS (ABSOLUTE): 0 10*3/uL (ref 0.0–0.4)
Eos: 0 %
Hematocrit: 50.3 % (ref 37.5–51.0)
Hemoglobin: 16.7 g/dL (ref 13.0–17.7)
Immature Grans (Abs): 0 10*3/uL (ref 0.0–0.1)
Immature Granulocytes: 0 %
Lymphocytes Absolute: 1.9 10*3/uL (ref 0.7–3.1)
Lymphs: 29 %
MCH: 28.9 pg (ref 26.6–33.0)
MCHC: 33.2 g/dL (ref 31.5–35.7)
MCV: 87 fL (ref 79–97)
Monocytes Absolute: 0.5 10*3/uL (ref 0.1–0.9)
Monocytes: 8 %
Neutrophils Absolute: 4 10*3/uL (ref 1.4–7.0)
Neutrophils: 62 %
Platelets: 274 10*3/uL (ref 150–450)
RBC: 5.78 x10E6/uL (ref 4.14–5.80)
RDW: 12.3 % (ref 11.6–15.4)
WBC: 6.5 10*3/uL (ref 3.4–10.8)

## 2020-05-17 LAB — RPR
RPR Ser Ql: REACTIVE — AB
RPR Titer: 1:4 {titer}

## 2020-05-17 MED ORDER — PNEUMOCOCCAL VAC POLYVALENT 25 MCG/0.5ML IJ INJ
0.5000 mL | INJECTION | INTRAMUSCULAR | Status: AC
  Administered 2020-05-18: 0.5 mL via INTRAMUSCULAR
  Filled 2020-05-17 (×2): qty 0.5

## 2020-05-17 MED ORDER — MAGNESIUM HYDROXIDE 400 MG/5ML PO SUSP: 30.0000 mL | Freq: Every day | ORAL | Status: DC | PRN

## 2020-05-17 MED ORDER — ACETAMINOPHEN 325 MG PO TABS: 650.0000 mg | ORAL_TABLET | Freq: Four times a day (QID) | ORAL | Status: DC | PRN

## 2020-05-17 MED ORDER — ALUM & MAG HYDROXIDE-SIMETH 200-200-20 MG/5ML PO SUSP: 30.0000 mL | ORAL | Status: DC | PRN

## 2020-05-17 MED ORDER — HYDROXYZINE HCL 50 MG PO TABS
50.0000 mg | ORAL_TABLET | Freq: Three times a day (TID) | ORAL | Status: DC | PRN
  Administered 2020-05-17 – 2020-05-28 (×7): 50 mg via ORAL
  Filled 2020-05-17 (×7): qty 1

## 2020-05-17 MED ORDER — INFLUENZA VAC SPLIT QUAD 0.5 ML IM SUSY
0.5000 mL | PREFILLED_SYRINGE | INTRAMUSCULAR | Status: AC
  Administered 2020-05-18: 0.5 mL via INTRAMUSCULAR
  Filled 2020-05-17 (×2): qty 0.5

## 2020-05-17 MED ORDER — TRAZODONE HCL 100 MG PO TABS
100.0000 mg | ORAL_TABLET | Freq: Every evening | ORAL | Status: DC | PRN
  Administered 2020-05-17 – 2020-05-28 (×5): 100 mg via ORAL
  Filled 2020-05-17 (×6): qty 1

## 2020-05-17 NOTE — BH Assessment (Signed)
Patient can come down at 3pm  Call to give report: [phone]  Patient is to be admitted to [facility] by Dr. Neale Burly.  Attending Physician will be. Dr. Neale Burly.   Patient has been assigned to room 303, by [facility] Charge Nurse Maryelizabeth Kaufmann, RN.   Intake Paper Work has been signed and placed on patient chart.  ER staff is aware of the admission: 1. Drinda Butts, ER Secretary  2. Scotty Court, ER MD  3. Amy T, Patient's Nurse  4. Tho, Patient Access.

## 2020-05-17 NOTE — ED Notes (Signed)
Hourly rounding reveals patient in room. No complaints, stable, in no acute distress. Q15 minute rounds and monitoring via Security Cameras to continue. 

## 2020-05-17 NOTE — ED Notes (Signed)
Pt with an observed visit from his sister   Visit observed by pt relations  Sisters contact info  Myah  [phone]

## 2020-05-17 NOTE — Plan of Care (Signed)
Patient new to the unit today, hasn't had time to progress  Problem: Education: Goal: Knowledge of [place] [facility] Education information/materials will improve Outcome: Not Progressing   Problem: Health Behavior/Discharge Planning: Goal: Identification of resources available to assist in meeting health care needs will improve Outcome: Not Progressing Goal: Compliance with treatment plan for underlying cause of condition will improve Outcome: Not Progressing   Problem: Safety: Goal: Periods of time without injury will increase Outcome: Not Progressing   Problem: Coping: Goal: Coping ability will improve Outcome: Not Progressing Goal: Will verbalize feelings Outcome: Not Progressing   Problem: Self-Concept: Goal: Level of anxiety will decrease Outcome: Not Progressing   Problem: Education: Goal: Will be free of psychotic symptoms Outcome: Not Progressing Goal: Knowledge of the prescribed therapeutic regimen will improve Outcome: Not Progressing   Problem: Health Behavior/Discharge Planning: Goal: Compliance with prescribed medication regimen will improve Outcome: Not Progressing

## 2020-05-17 NOTE — Tx Team (Signed)
Initial Treatment Plan 05/17/2020 6:04 PM Bonnee Quin [id number]    PATIENT STRESSORS: Financial difficulties Substance abuse   PATIENT STRENGTHS: Communication skills General fund of knowledge Supportive family/friends Work skills   PATIENT IDENTIFIED PROBLEMS: Paranoia  Hallucinations  Depression  Anxiety               DISCHARGE CRITERIA:  Ability to meet basic life and health needs Improved stabilization in mood, thinking, and/or behavior Need for constant or close observation no longer present Reduction of life-threatening or endangering symptoms to within safe limits  PRELIMINARY DISCHARGE PLAN: Outpatient therapy Return to previous living arrangement Return to previous work or school arrangements  PATIENT/FAMILY INVOLVEMENT: This treatment plan has been presented to and reviewed with the patient, Christopher Morgan. The patient has been given the opportunity to ask questions and make suggestions.  Kyle Stansell, RN 05/17/2020, 6:04 PM

## 2020-05-17 NOTE — Progress Notes (Signed)
Admission Note:   Report was received from Amy, RN on a 24 year old male who presents IVC in no acute distress for the treatment of Psychosis/Paranoia. Patient stated that he's here for a "mental break down at home. I felt sick, feeling like someone is trying to kill me". Patient also stated that he was hallucinating before coming into the hospital, but denies AVH at this time. Patient was animated upon arrival to the unit and became tearful throughout assessment. Patient was calm and cooperative with admission process. Patient endorsed both depression and anxiety, stating that being "in that room upstairs" and that the room was small, has him feeling this way. Patient reported that his anxiety "is going away because of you". Patient also denies any SI/HI to this Clinical research associate. Patient has no significant past medical history on file. He did state to this Clinical research associate that this is the first time that something like this has ever happened to him. Patient's goal for treatment are to "make sure that I'm alright and successful". Skin was assessed with Gigi, RN and found to be clear of any abnormal marks apart from some acne to his face and back and a few tattoos. Patient searched and no contraband found and unit policies explained and understanding verbalized. Consents obtained. Food and fluids offered, and accepted. Patient had no additional questions or concerns at this time.

## 2020-05-17 NOTE — ED Provider Notes (Signed)
Emergency Medicine Observation Re-evaluation Note  Christopher Morgan is a 24 y.o. male, seen on rounds today.  Pt initially presented to the ED for complaints of Mental Health Problem Currently, the patient is calm, no complaints.  Physical Exam  BP 121/88 (BP Location: Left Arm)   Pulse 92   Temp 98.2 F (36.8 C) (Oral)   Resp 17   SpO2 99%  Physical Exam General: nad  Lungs: unlabored breathing Psych: withdrawn  ED Course / MDM  EKG:    I have reviewed the labs performed to date as well as medications administered while in observation.  Recent changes in the last 24 hours include evaluated by psychiatry who recommends continuing IVC, psych admission. Noted to have prior positive RPR and HIV tests, being repeated currently. Hep panel negative.  Plan  Current plan is for psych hospitalization. . Patient is under full IVC at this time.   Sharman Cheek, MD 05/17/20 [phone]

## 2020-05-17 NOTE — Progress Notes (Signed)
Patient calm and cooperative during assessment, denying SI/HI/AVH. Patient endorses anxiety and depression stating it's from being in here. Patient presents with paranoia but stated he felt safe here with [place]. Patient given education, support, and encouragement to be active in his treatment plan. Patient observed interacting appropriately with staff and peers on the unit. Patient being monitored Q 15 minutes for safety per unit protocol. Patient remains safe on the unit.

## 2020-05-17 NOTE — Plan of Care (Signed)
New admission.  Problem: Education: Goal: Knowledge of [place] [facility] Education information/materials will improve Outcome: Not Progressing   Problem: Health Behavior/Discharge Planning: Goal: Identification of resources available to assist in meeting health care needs will improve Outcome: Not Progressing Goal: Compliance with treatment plan for underlying cause of condition will improve Outcome: Not Progressing   Problem: Safety: Goal: Periods of time without injury will increase Outcome: Not Progressing   Problem: Coping: Goal: Coping ability will improve Outcome: Not Progressing Goal: Will verbalize feelings Outcome: Not Progressing   Problem: Self-Concept: Goal: Level of anxiety will decrease Outcome: Not Progressing   Problem: Education: Goal: Will be free of psychotic symptoms Outcome: Not Progressing Goal: Knowledge of the prescribed therapeutic regimen will improve Outcome: Not Progressing   Problem: Health Behavior/Discharge Planning: Goal: Compliance with prescribed medication regimen will improve Outcome: Not Progressing

## 2020-05-17 NOTE — Consult Note (Signed)
NAME: Christopher Morgan  DOB: 11-09-1995  MRN: [id number]  Date/Time: 05/17/2020 8:13 PM  REQUESTING PROVIDER: Dr. Toni Amend Subjective:  REASON FOR CONSULT: HIV and positive RPR Patient was brought to the patient is in behavioral health unit for acute psychosis.  On chart review Dr. Toni Amend noted that the patient was positive for HIV in 2019 and also had a positive RPR screen.  So I am asked to see the patient. On talking to the patient he is kind of vaguely aware that he is HIV positive but has not been on any treatment. He says he did get penicillin injection but not sure where he got it. He is a very historian and I am not able to elicit any other history from him. Also he just got transferred from the ED to the behavioral health unit and is resting.  Social History   Socioeconomic History  . Marital status: Single    Spouse name: Not on file  . Number of children: Not on file  . Years of education: Not on file  . Highest education level: Not on file  Occupational History  . Not on file  Tobacco Use  . Smoking status: Current Every Day Smoker    Packs/day: 0.03    Types: Cigarettes  . Smokeless tobacco: Never Used  Substance and Sexual Activity  . Alcohol use: No  . Drug use: No  . Sexual activity: Not on file  Other Topics Concern  . Not on file  Social History Narrative  . Not on file   Social Determinants of Health   Financial Resource Strain:   . Difficulty of Paying Living Expenses: Not on file  Food Insecurity:   . Worried About Programme researcher, broadcasting/film/video in the Last Year: Not on file  . Ran Out of Food in the Last Year: Not on file  Transportation Needs:   . Lack of Transportation (Medical): Not on file  . Lack of Transportation (Non-Medical): Not on file  Physical Activity:   . Days of Exercise per Week: Not on file  . Minutes of Exercise per Session: Not on file  Stress:   . Feeling of Stress : Not on file  Social Connections:   . Frequency of Communication with  Friends and Family: Not on file  . Frequency of Social Gatherings with Friends and Family: Not on file  . Attends Religious Services: Not on file  . Active Member of Clubs or Organizations: Not on file  . Attends Banker Meetings: Not on file  . Marital Status: Not on file  Intimate Partner Violence:   . Fear of Current or Ex-Partner: Not on file  . Emotionally Abused: Not on file  . Physically Abused: Not on file  . Sexually Abused: Not on file    History reviewed. No pertinent family history. No Known Allergies  Current Facility-Administered Medications  Medication Dose Route Frequency Provider Last Rate Last Admin  . acetaminophen (TYLENOL) tablet 650 mg  650 mg Oral Q6H PRN Clapacs, John T, MD      . alum & mag hydroxide-simeth (MAALOX/MYLANTA) 200-200-20 MG/5ML suspension 30 mL  30 mL Oral Q4H PRN Clapacs, John T, MD      . hydrOXYzine (ATARAX/VISTARIL) tablet 50 mg  50 mg Oral TID PRN Clapacs, [name], MD      . Melene Muller ON 05/18/2020] influenza vac split quadrivalent PF (FLUARIX) injection 0.5 mL  0.5 mL Intramuscular Tomorrow-1000 [name], MD      .  magnesium hydroxide (MILK OF MAGNESIA) suspension 30 mL  30 mL Oral Daily PRN Clapacs, John T, MD      . Melene Muller ON 05/18/2020] pneumococcal 23 valent vaccine (PNEUMOVAX-23) injection 0.5 mL  0.5 mL Intramuscular Tomorrow-1000 [name], MD      . traZODone (DESYREL) tablet 100 mg  100 mg Oral QHS PRN Clapacs, [name], MD         Abtx:  Anti-infectives (From admission, onward)   None      REVIEW OF SYSTEMS:  Patient denies any fever or chills headaches shortness of breath, cough or chest pain.  He does not have any pain abdomen or diarrhea or nausea.  He does not have any problem passing urine. : Objective:  VITALS:  BP 124/88 (BP Location: Left Arm)   Pulse 100   Temp 97.8 F (36.6 C) (Oral)   Resp 18   Ht 5\' 7"  (1.702 m)   Wt 58.1 kg   SpO2 99%   BMI 20.05 kg/m  PHYSICAL EXAM:  General:  Alert, cooperative, no distress, appears stated age.  Head: Normocephalic, without obvious abnormality, atraumatic. Eyes: Conjunctivae clear, anicteric sclerae. Pupils are equal ENT Nares normal. No drainage or sinus tenderness. Lips, mucosa, and tongue normal. No Thrush Neck: Supple, symmetrical, no adenopathy, thyroid: non tender no carotid bruit and no JVD. Back: No CVA tenderness. Lungs: Clear to auscultation bilaterally. No Wheezing or Rhonchi. No rales. Heart: Regular rate and rhythm, no murmur, rub or gallop. Abdomen: Soft, non-tender,not distended. Bowel sounds normal. No masses Extremities: atraumatic, no cyanosis. No edema. No clubbing Skin: No rashes or lesions. Or bruising Lymph: Cervical, supraclavicular normal. Neurologic: Grossly non-focal Pertinent Labs Lab Results CBC    Component Value Date/Time   WBC 6.5 05/16/2020 1745   WBC 6.8 05/16/2020 0611   RBC 5.78 05/16/2020 1745   RBC 5.12 05/16/2020 0611   HGB 16.7 05/16/2020 1745   HCT 50.3 05/16/2020 1745   PLT 274 05/16/2020 1745   MCV 87 05/16/2020 1745   MCH 28.9 05/16/2020 1745   MCH 29.1 05/16/2020 0611   MCHC 33.2 05/16/2020 1745   MCHC 33.9 05/16/2020 0611   RDW 12.3 05/16/2020 1745   LYMPHSABS 1.9 05/16/2020 1745   EOSABS 0.0 05/16/2020 1745   BASOSABS 0.0 05/16/2020 1745    CMP Latest Ref Rng & Units 05/16/2020  Glucose 70 - 99 mg/dL 93  BUN 6 - 20 mg/dL 14  Creatinine 05/18/2020 - 0.73 mg/dL 7.10  Sodium 6.26 - 948 mmol/L 137  Potassium 3.5 - 5.1 mmol/L 3.8  Chloride 98 - 111 mmol/L 103  CO2 22 - 32 mmol/L 24  Calcium 8.9 - 10.3 mg/dL 9.3  Total Protein 6.5 - 8.1 g/dL 7.8  Total Bilirubin 0.3 - 1.2 mg/dL 0.9  Alkaline Phos 38 - 126 U/L 54  AST 15 - 41 U/L 23  ALT 0 - 44 U/L 9      Microbiology: Recent Results (from the past 240 hour(s))  Respiratory Panel by RT PCR (Flu A&B, Covid) - Nasopharyngeal Swab     Status: None   Collection Time: 05/16/20  8:18 AM   Specimen: Nasopharyngeal Swab   Result Value Ref Range Status   SARS Coronavirus 2 by RT PCR NEGATIVE NEGATIVE Final    Comment: (NOTE) SARS-CoV-2 target nucleic acids are NOT DETECTED.  The SARS-CoV-2 RNA is generally detectable in upper respiratoy specimens during the acute phase of infection. The lowest concentration of SARS-CoV-2 viral copies this assay can detect  is 131 copies/mL. A negative result does not preclude SARS-Cov-2 infection and should not be used as the sole basis for treatment or other patient management decisions. A negative result may occur with  improper specimen collection/handling, submission of specimen other than nasopharyngeal swab, presence of viral mutation(s) within the areas targeted by this assay, and inadequate number of viral copies (<131 copies/mL). A negative result must be combined with clinical observations, patient history, and epidemiological information. The expected result is Negative.  Fact Sheet for Patients:  https://www.moore.com/  Fact Sheet for Healthcare Providers:  https://www.young.biz/  This test is no t yet approved or cleared by the [place] FDA and  has been authorized for detection and/or diagnosis of SARS-CoV-2 by FDA under an Emergency Use Authorization (EUA). This EUA will remain  in effect (meaning this test can be used) for the duration of the COVID-19 declaration under Section 564(b)(1) of the Act, 21 U.S.C. section 360bbb-3(b)(1), unless the authorization is terminated or revoked sooner.     Influenza A by PCR NEGATIVE NEGATIVE Final   Influenza B by PCR NEGATIVE NEGATIVE Final    Comment: (NOTE) The Xpert Xpress SARS-CoV-2/FLU/RSV assay is intended as an aid in  the diagnosis of influenza from Nasopharyngeal swab specimens and  should not be used as a sole basis for treatment. Nasal washings and  aspirates are unacceptable for Xpert Xpress SARS-CoV-2/FLU/RSV  testing.  Fact Sheet for  Patients: https://www.moore.com/  Fact Sheet for Healthcare Providers: https://www.young.biz/  This test is not yet approved or cleared by the [place] FDA and  has been authorized for detection and/or diagnosis of SARS-CoV-2 by  FDA under an Emergency Use Authorization (EUA). This EUA will remain  in effect (meaning this test can be used) for the duration of the  Covid-19 declaration under Section 564(b)(1) of the Act, 21  U.S.C. section 360bbb-3(b)(1), unless the authorization is  terminated or revoked. Performed at [facility], [address] [place], [place] [address]     IMAGING RESULTS: I have personally reviewed the films ? Impression/Recommendation ? HIV disease..  Patient was tested in 2019 in the emergency department at [facility] and was positive.  The disease intervention specialist tiara Remus Loffler was notified by Dr. Sampson Goon as patient was not reachable. Patient is vague about the knowledge of HIV even though he did comment that somebody had mentioned it to him.  He is not engaged in care. We will call  community center tomorrow and try to get him into care. CD4 count done yesterday is 635 with 33%. He is not anemic His platelet count is normal His LFTs are normal Viral load is pending.  RPR 1 is to 4.  Was 1:16 in  2019 and had received penicillin but I need to find out whether he got it at health department.  And how many doses he had received.  If needed we will retreat him.  Patient has acute psychosis and is currently in the behavioral unit resting.  Will need to see him again tomorrow to discuss further. Discussed the management with his nurse.   ? ___________________________________________________ Discussed with patient, requesting provider Note:  This document was prepared using Dragon voice recognition software and may include unintentional dictation errors.

## 2020-05-17 NOTE — ED Notes (Signed)
Gave lunch tray with drink. 

## 2020-05-18 DIAGNOSIS — Z8619 Personal history of other infectious and parasitic diseases: Secondary | ICD-10-CM | POA: Diagnosis present

## 2020-05-18 LAB — T.PALLIDUM AB, TOTAL: T Pallidum Abs: REACTIVE — AB

## 2020-05-18 LAB — LIPID PANEL
Cholesterol: 131 mg/dL (ref 0–200)
HDL: 51 mg/dL (ref >40)
LDL Cholesterol: 59 mg/dL (ref 0–99)
Total CHOL/HDL Ratio: 2.6 RATIO
Triglycerides: 105 mg/dL (ref <150)
VLDL: 21 mg/dL (ref 0–40)

## 2020-05-18 LAB — HEMOGLOBIN A1C
Hgb A1c MFr Bld: 5 % (ref 4.8–5.6)
Mean Plasma Glucose: 97 mg/dL

## 2020-05-18 LAB — HIV-1 RNA, QUALITATIVE, TMA: HIV-1 RNA, Qualitative, TMA: POSITIVE — AB

## 2020-05-18 MED ORDER — OLANZAPINE 10 MG IM SOLR: 5.0000 mg | Freq: Three times a day (TID) | INTRAMUSCULAR | Status: DC | PRN

## 2020-05-18 MED ORDER — ARIPIPRAZOLE 5 MG PO TABS
15.0000 mg | ORAL_TABLET | Freq: Every day | ORAL | Status: DC
  Administered 2020-05-18 – 2020-05-22 (×5): 15 mg via ORAL
  Filled 2020-05-18 (×5): qty 1

## 2020-05-18 MED ORDER — OLANZAPINE 5 MG PO TABS
5.0000 mg | ORAL_TABLET | Freq: Three times a day (TID) | ORAL | Status: DC | PRN
  Administered 2020-05-18 – 2020-05-21 (×2): 5 mg via ORAL
  Filled 2020-05-18 (×3): qty 1

## 2020-05-18 NOTE — Progress Notes (Signed)
Patient just came up to this writer stating that he is feeling "stressed, but depressed", and requested medication. This Clinical research associate administered PRN Zyprexa.

## 2020-05-18 NOTE — [facility] Suicide Risk Assessment (Signed)
[facility] INPATIENT:  Family/Significant Other Suicide Prevention Education  Suicide Prevention Education:  Contact Attempts: Fontaine No, mother, [phone], has been identified by the patient as the family member/significant other with whom the patient will be residing, and identified as the person(s) who will aid the patient in the event of a mental health crisis.  With written consent from the patient, two attempts were made to provide suicide prevention education, prior to and/or following the patient's discharge.  We were unsuccessful in providing suicide prevention education.  A suicide education pamphlet was given to the patient to share with family/significant other.  Date and time of first attempt: 05/18/2020 at 10:49AM Date and time of second attempt: Second attempt is needed.  CSW left HIPAA compliant voicemail.   Harden Mo 05/18/2020, 10:48 AM

## 2020-05-18 NOTE — Progress Notes (Signed)
Recreation Therapy Notes  INPATIENT RECREATION THERAPY ASSESSMENT  Patient Details Name: Jamelle Goldston MRN: [id number] DOB: 29-Apr-1996 Today's Date: 05/18/2020       Information Obtained From: Patient  Able to Participate in Assessment/Interview: Yes  Patient Presentation: Responsive  Reason for Admission (Per Patient): Active Symptoms  Patient Stressors:    Coping Skills:   Talk, Meditate, Other (Comment) (Motivate my self)  Leisure Interests (2+):  Social - Family (Working, Eating)  Frequency of Recreation/Participation: Weekly  Awareness of Community Resources:  Yes  Community Resources:  Park  Current Use: Yes  If no, Barriers?: Other (Comment)  Expressed Interest in [employer] Information:    [place] of Residence:  Film/video editor  Patient Main Form of Transportation: Other (Comment) (Family)  Patient Strengths:  Communication, team work, people skills  Patient Identified Areas of Improvement:  Getting out this hospital  Patient Goal for Hospitalization:  To go home  Current SI (including self-harm):  No  Current HI:  No  Current AVH: No  Staff Intervention Plan: Group Attendance, Collaborate with Interdisciplinary Treatment Team  Consent to Intern Participation: N/A  Senora Lacson 05/18/2020, 2:38 PM

## 2020-05-18 NOTE — [facility] Group Notes (Signed)
LCSW Group Therapy Note  05/18/2020 3:37 PM  Type of Therapy and Topic:  Group Therapy:  Feelings around Relapse and Recovery  Participation Level:  Minimal   Description of Group:    Patients in this group will discuss emotions they experience before and after a relapse. They will process how experiencing these feelings, or avoidance of experiencing them, relates to having a relapse. Facilitator will guide patients to explore emotions they have related to recovery. Patients will be encouraged to process which emotions are more powerful. They will be guided to discuss the emotional reaction significant others in their lives may have to their relapse or recovery. Patients will be assisted in exploring ways to respond to the emotions of others without this contributing to a relapse.  Therapeutic Goals: 1. Patient will identify two or more emotions that lead to a relapse for them 2. Patient will identify two emotions that result when they relapse 3. Patient will identify two emotions related to recovery 4. Patient will demonstrate ability to communicate their needs through discussion and/or role plays   Summary of Patient Progress: Pt was not active in the group process. He appeared to sleep off and on throughout the group.    Therapeutic Modalities:   Cognitive Behavioral Therapy Solution-Focused Therapy Assertiveness Training Relapse Prevention Therapy   Simona Huh R. Algis Greenhouse, MSW, LCSW, LCAS 05/18/2020 3:37 PM

## 2020-05-18 NOTE — Progress Notes (Signed)
Recreation Therapy Notes   Date: 05/18/2020  Time: 9:30 am   Location: Craft room     Behavioral response: N/A   Intervention Topic: Self-care   Discussion/Intervention: Patient did not attend group.   Clinical Observations/Feedback:  Patient did not attend group.   Bluma Buresh LRT/CTRS        Davonda Ausley 05/18/2020 1:05 PM 

## 2020-05-18 NOTE — H&P (Signed)
Psychiatric Admission Assessment Adult  Patient Identification: Christopher Morgan MRN:  [id number] Date of Evaluation:  05/18/2020 Chief Complaint:  Psychosis [facility]) [F29] Principal Diagnosis: Psychosis (HCC) Diagnosis:  Principal Problem:   Psychosis (HCC) Active Problems:   HIV positive (HCC)   History of syphilis  History of Present Illness:  Patient seen during treatment team and again one-on-one. Patient is very guarded and paranoid about providers. He states he feels he is being held in place for no reason. He feels we are trying to drain his energy with the amount of blood we have drawn. His affect is bizarre and labile alternating from euphoric to irritable to tearful. He does admit to having a "mental breakdown" and calling the police. However, states he is fine now and ready to go home. He admits to feeling anxious being away from his mother and twin sister. When one one one discussed the possibility of mood disorder such as bipolar disorder, and patient becomes extremely angry. He is unreceptive to hearing possible symptoms, and insists he is simply energetic because he was a Production designer, theatre/television/film at [employer]. He does admit to hearing he has a history of HIV and syphilis, but remains a poor historian for work up he has had in the past. Ultimately, he terminates interview and walks away from provider before medication options could be discussed. Will offer Abilify to patient for mood and psychosis. Will also have Zyprexa 5 mg PO or IM Q8hr PRN for acute agitation. Currently being follow by infectious disease for HIV and syphilis workup. Appreciate assistance.   Associated Signs/Symptoms: Depression Symptoms:  insomnia, Duration of Depression Symptoms: No data recorded (Hypo) Manic Symptoms:  Distractibility, Elevated Mood, Flight of Ideas, Impulsivity, Irritable Mood, Labiality of Mood, Anxiety Symptoms:  Excessive Worry, Panic Symptoms, Psychotic Symptoms:  Paranoia, Duration of Psychotic  Symptoms: No data recorded PTSD Symptoms: Negative Total Time spent with patient: 45 minutes  Past Psychiatric History: Mother reports that as a child he had some odd behavior and some school refusal at times.  Patient had no reported memory of it.  No clear diagnosis.  As an adult appears to have had no mental health issues reported  Is the patient at risk to self? Yes.    Has the patient been a risk to self in the past 6 months? No.  Has the patient been a risk to self within the distant past? No.  Is the patient a risk to others? No.  Has the patient been a risk to others in the past 6 months? No.  Has the patient been a risk to others within the distant past? No.   Prior Inpatient Therapy:   Prior Outpatient Therapy:    Alcohol Screening: 1. How often do you have a drink containing alcohol?: Monthly or less 2. How many drinks containing alcohol do you have on a typical day when you are drinking?: 3 or 4 3. How often do you have six or more drinks on one occasion?: Never AUDIT-C Score: 2 4. How often during the last year have you found that you were not able to stop drinking once you had started?: Never 5. How often during the last year have you failed to do what was normally expected from you because of drinking?: Never 6. How often during the last year have you needed a first drink in the morning to get yourself going after a heavy drinking session?: Never 7. How often during the last year have you had a feeling of guilt of  remorse after drinking?: Never 8. How often during the last year have you been unable to remember what happened the night before because you had been drinking?: Never 9. Have you or someone else been injured as a result of your drinking?: No 10. Has a relative or friend or a doctor or another health worker been concerned about your drinking or suggested you cut down?: No Alcohol Use Disorder Identification Test Final Score (AUDIT): 2 Alcohol Brief  Interventions/Follow-up: AUDIT Score <7 follow-up not indicated Substance Abuse History in the last 12 months:  Yes.   Consequences of Substance Abuse: Negative Previous Psychotropic Medications: No  Psychological Evaluations: Yes  Past Medical History: History reviewed. No pertinent past medical history. History reviewed. No pertinent surgical history. Family History: History reviewed. No pertinent family history. Family Psychiatric  History: None reported Tobacco Screening:   Social History:  Social History   Substance and Sexual Activity  Alcohol Use No     Social History   Substance and Sexual Activity  Drug Use No    Additional Social History: Marital status: Single Does patient have children?: No                         Allergies:  No Known Allergies Lab Results:  Results for orders placed or performed during the hospital encounter of 05/16/20 (from the past 48 hour(s))  Urine Drug Screen, Qualitative ([facility] only)     Status: Abnormal   Collection Time: 05/16/20  5:04 PM  Result Value Ref Range   Tricyclic, Ur Screen NONE DETECTED NONE DETECTED   Amphetamines, Ur Screen NONE DETECTED NONE DETECTED   MDMA (Ecstasy)Ur Screen NONE DETECTED NONE DETECTED   Cocaine Metabolite,Ur [place] NONE DETECTED NONE DETECTED   Opiate, Ur Screen NONE DETECTED NONE DETECTED   Phencyclidine (PCP) Ur S NONE DETECTED NONE DETECTED   Cannabinoid 50 Ng, Ur [place] POSITIVE (A) NONE DETECTED   Barbiturates, Ur Screen NONE DETECTED NONE DETECTED   Benzodiazepine, Ur Scrn POSITIVE (A) NONE DETECTED   Methadone Scn, Ur NONE DETECTED NONE DETECTED    Comment: (NOTE) Tricyclics + metabolites, urine    Cutoff 1000 ng/mL Amphetamines + metabolites, urine  Cutoff 1000 ng/mL MDMA (Ecstasy), urine              Cutoff 500 ng/mL Cocaine Metabolite, urine          Cutoff 300 ng/mL Opiate + metabolites, urine        Cutoff 300 ng/mL Phencyclidine (PCP), urine         Cutoff 25 ng/mL Cannabinoid,  urine                 Cutoff 50 ng/mL Barbiturates + metabolites, urine  Cutoff 200 ng/mL Benzodiazepine, urine              Cutoff 200 ng/mL Methadone, urine                   Cutoff 300 ng/mL  The urine drug screen provides only a preliminary, unconfirmed analytical test result and should not be used for non-medical purposes. Clinical consideration and professional judgment should be applied to any positive drug screen result due to possible interfering substances. A more specific alternate chemical method must be used in order to obtain a confirmed analytical result. Gas chromatography / mass spectrometry (GC/MS) is the preferred confirm atory method. Performed at [facility], [address] [place], [place] [address]   RPR  Status: Abnormal   Collection Time: 05/16/20  5:45 PM  Result Value Ref Range   RPR Ser Ql Reactive (A) NON REACTIVE    Comment: SENT FOR CONFIRMATION   RPR Titer 1:4     Comment: Performed at [facility], [address] [place], [place] [address]  Helper T-Lymph-CD4 [facility] only)     Status: None   Collection Time: 05/16/20  5:45 PM  Result Value Ref Range   Absolute CD 4 Helper 635 359 - 1,519 /uL   % CD 4 Pos. Lymph. 33.4 30.8 - 58.5 %   WBC 6.5 3.4 - 10.8 x10E3/uL   RBC 5.78 4.14 - 5.80 x10E6/uL   Hematocrit 50.3 37.5 - 51.0 %   MCV 87 79 - 97 fL   MCH 28.9 26.6 - 33.0 pg   MCHC 33.2 31 - 35 g/dL   RDW 85.9 29.2 - 44.6 %   Platelets 274 150 - 450 x10E3/uL   Neutrophils 62 Not Estab. %   Lymphs 29 Not Estab. %   Monocytes 8 Not Estab. %   Eos 0 Not Estab. %   Basos 1 Not Estab. %   Neutrophils Absolute 4.0 1.40 - 7.00 x10E3/uL   Lymphocytes Absolute 1.9 0 - 3 x10E3/uL   Monocytes Absolute 0.5 0 - 0 x10E3/uL   EOS (ABSOLUTE) 0.0 0.0 - 0.4 x10E3/uL   Basophils Absolute 0.0 0 - 0 x10E3/uL   Immature Granulocytes 0 Not Estab. %   Immature Grans (Abs) 0.0 0.0 - 0.1 x10E3/uL    Comment: (NOTE) Performed At: [facility] [address] [place], [place] [id number] Jolene Schimke MD HA:[id number]    Hemoglobin 16.7 13.0 - 17.7 g/dL  Hepatitis panel, acute     Status: None   Collection Time: 05/16/20  5:45 PM  Result Value Ref Range   Hepatitis B Surface Ag NON REACTIVE NON REACTIVE   HCV Ab NON REACTIVE NON REACTIVE    Comment: (NOTE) Nonreactive HCV antibody screen is consistent with no HCV infections,  unless recent infection is suspected or other evidence exists to indicate HCV infection.     Hep A IgM NON REACTIVE NON REACTIVE   Hep B C IgM NON REACTIVE NON REACTIVE    Comment: Performed at [facility], [address] [place], [place] [address]    Blood Alcohol level:  Lab Results  Component Value Date   ETH <10 05/16/2020    Metabolic Disorder Labs:  No results found for: HGBA1C, MPG No results found for: PROLACTIN No results found for: CHOL, TRIG, HDL, CHOLHDL, VLDL, LDLCALC  Current Medications: Current Facility-Administered Medications  Medication Dose Route Frequency Provider Last Rate Last Admin  . acetaminophen (TYLENOL) tablet 650 mg  650 mg Oral Q6H PRN Clapacs, John T, MD      . alum & mag hydroxide-simeth (MAALOX/MYLANTA) 200-200-20 MG/5ML suspension 30 mL  30 mL Oral Q4H PRN Clapacs, John T, MD      . ARIPiprazole (ABILIFY) tablet 15 mg  15 mg Oral Daily [name], MD      . hydrOXYzine (ATARAX/VISTARIL) tablet 50 mg  50 mg Oral TID PRN Clapacs, [name], MD   50 mg at 05/17/20 2126  . influenza vac split quadrivalent PF (FLUARIX) injection 0.5 mL  0.5 mL Intramuscular Tomorrow-1000 [name], MD      . magnesium hydroxide (MILK OF MAGNESIA) suspension 30 mL  30 mL Oral Daily PRN Clapacs, [name], MD      .  OLANZapine (ZYPREXA) tablet 5 mg  5 mg Oral Q8H PRN Jesse SansFreeman, Augustine Brannick M, MD       Or  . OLANZapine (ZYPREXA) injection 5 mg  5 mg Intramuscular Q8H PRN Jesse SansFreeman, Taiwo Fish M, MD      . pneumococcal 23 valent vaccine (PNEUMOVAX-23) injection 0.5 mL  0.5  mL Intramuscular Tomorrow-1000 Jesse SansFreeman, Krrish Freund M, MD      . traZODone (DESYREL) tablet 100 mg  100 mg Oral QHS PRN Clapacs, Jackquline DenmarkJohn T, MD   100 mg at 05/17/20 2126   PTA Medications: Medications Prior to Admission  Medication Sig Dispense Refill Last Dose  . azithromycin (ZITHROMAX Z-PAK) 250 MG tablet Take 2 tablets (500 mg) on  Day 1,  followed by 1 tablet (250 mg) once daily on Days 2 through 5. (Patient not taking: Reported on 05/16/2020) 6 each 0   . brompheniramine-pseudoephedrine-DM 30-2-10 MG/5ML syrup Take 10 mLs by mouth 4 (four) times daily as needed. (Patient not taking: Reported on 05/16/2020) 200 mL 0   . cephALEXin (KEFLEX) 500 MG capsule Take 1 capsule (500 mg total) by mouth 3 (three) times daily. (Patient not taking: Reported on 05/16/2020) 21 capsule 0   . HYDROcodone-acetaminophen (NORCO) 5-325 MG tablet Take 1 tablet by mouth every 6 (six) hours as needed for moderate pain. (Patient not taking: Reported on 05/16/2020) 15 tablet 0   . ibuprofen (ADVIL,MOTRIN) 600 MG tablet Take 1 tablet (600 mg total) by mouth every 8 (eight) hours as needed. (Patient not taking: Reported on 05/16/2020) 15 tablet 0   . traMADol (ULTRAM) 50 MG tablet Take 1 tablet (50 mg total) by mouth every 6 (six) hours as needed for moderate pain. (Patient not taking: Reported on 05/16/2020) 12 tablet 0     Musculoskeletal: Strength & Muscle Tone: within normal limits Gait & Station: normal Patient leans: N/A  Psychiatric Specialty Exam: Physical Exam Vitals and nursing note reviewed.  Constitutional:      Appearance: Normal appearance.  HENT:     Head: Normocephalic and atraumatic.     Right Ear: External ear normal.     Left Ear: External ear normal.     Nose: Nose normal.     Mouth/Throat:     Mouth: Mucous membranes are moist.     Pharynx: Oropharynx is clear.  Eyes:     Extraocular Movements: Extraocular movements intact.     Conjunctiva/sclera: Conjunctivae normal.     Pupils: Pupils are  equal, round, and reactive to light.  Cardiovascular:     Rate and Rhythm: Normal rate.     Pulses: Normal pulses.  Pulmonary:     Effort: Pulmonary effort is normal.     Breath sounds: Normal breath sounds.  Abdominal:     General: Abdomen is flat.     Palpations: Abdomen is soft.  Musculoskeletal:        General: No swelling. Normal range of motion.     Cervical back: Normal range of motion and neck supple.  Skin:    General: Skin is warm and dry.  Neurological:     General: No focal deficit present.     Mental Status: He is alert and oriented to person, place, and time.  Psychiatric:        Attention and Perception: He is inattentive.        Mood and Affect: Mood is anxious. Affect is labile.        Speech: Speech is rapid and pressured.  Behavior: Behavior is agitated.        Thought Content: Thought content is paranoid.        Cognition and Memory: Cognition is impaired. Memory is impaired.        Judgment: Judgment is impulsive.     Review of Systems  Constitutional: Negative for appetite change and fatigue.  HENT: Negative for rhinorrhea and sore throat.   Eyes: Negative for photophobia and visual disturbance.  Respiratory: Negative for cough and shortness of breath.   Cardiovascular: Negative for chest pain and palpitations.  Gastrointestinal: Negative for constipation, diarrhea, nausea and vomiting.  Endocrine: Negative for cold intolerance and heat intolerance.  Genitourinary: Negative for difficulty urinating and dysuria.  Musculoskeletal: Negative for arthralgias and back pain.  Skin: Negative for rash and wound.  Allergic/Immunologic: Negative for environmental allergies and food allergies.  Neurological: Negative for dizziness and headaches.  Hematological: Negative for adenopathy. Does not bruise/bleed easily.  Psychiatric/Behavioral: Positive for behavioral problems and sleep disturbance. The patient is nervous/anxious.     Blood pressure 110/82,  pulse 87, temperature 98.2 F (36.8 C), temperature source Oral, resp. rate 17, height  (1.702 m), weight 58.1 kg, SpO2 99 %.Body mass index is 20.05 kg/m.  General Appearance: Fairly Groomed  Eye Contact:  Good  Speech:  Pressured  Volume:  Normal  Mood:  alternating from euphoric to irritable   Affect:  Inappropriate, Labile and Tearful  Thought Process:  Disorganized  Orientation:  Full (Time, Place, and Person)  Thought Content:  Illogical and Paranoid Ideation  Suicidal Thoughts:  No  Homicidal Thoughts:  No  Memory:  Immediate;   Poor Recent;   Poor Remote;   Poor  Judgement:  Impaired  Insight:  Lacking  Psychomotor Activity:  Restlessness  Concentration:  Concentration: Poor and Attention Span: Poor  Recall:  Poor  Fund of Knowledge:  Fair  Language:  Good  Akathisia:  Negative  Handed:  Right  AIMS (if indicated):     Assets:  Communication Skills Desire for Improvement Housing Resilience Social Support  ADL's:  Intact  Cognition:  Impaired,  Mild  Sleep:         Treatment Plan Summary: Daily contact with patient to assess and evaluate symptoms and progress in treatment and Medication management. Plan: Continue involuntary committment. Will offer Abilify 15 mg daily for mood and psychosis. Zyprexa 5 mg PO or IM Q8hr PRN for acute agitation. Appreciate assistance of infectious disease for HIV and syphilis workup and medication recommendations.   Observation Level/Precautions:  15 minute checks  Laboratory:  lipid panel, hemoglobin a1c  Psychotherapy:    Medications:    Consultations:    Discharge Concerns:    Estimated LOS:  Other:     Physician Treatment Plan for Primary Diagnosis: Psychosis (HCC) Long Term Goal(s): Improvement in symptoms so as ready for discharge  Short Term Goals: Ability to identify changes in lifestyle to reduce recurrence of condition will improve, Ability to verbalize feelings will improve, Ability to demonstrate self-control  will improve, Ability to identify and develop effective coping behaviors will improve, Compliance with prescribed medications will improve and Ability to identify triggers associated with substance abuse/mental health issues will improve  Physician Treatment Plan for Secondary Diagnosis: Principal Problem:   Psychosis (HCC) Active Problems:   HIV positive (HCC)   History of syphilis  Long Term Goal(s): Improvement in symptoms so as ready for discharge  Short Term Goals: Ability to identify changes in lifestyle to reduce recurrence of  condition will improve and Compliance with prescribed medications will improve  I certify that inpatient services furnished can reasonably be expected to improve the patient's condition.    [name], MD 10/29/202110:37 AM
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
## 2020-05-18 NOTE — [facility] Suicide Risk Assessment (Signed)
[facility] Admission Suicide Risk Assessment   Nursing information obtained from:  Patient Demographic factors:  Male, Adolescent or young adult, Cardell Peach, lesbian, or bisexual orientation Current Mental Status:  NA Loss Factors:  NA Historical Factors:  NA Risk Reduction Factors:  Employed, Living with another person, especially a relative  Total Time spent with patient: 45 minutes Principal Problem: Psychosis (HCC) Diagnosis:  Principal Problem:   Psychosis (HCC) Active Problems:   HIV positive (HCC)   History of syphilis  Subjective Data: Patient seen during treatment team and again one-on-one. Patient is very guarded and paranoid about providers. He states he feels he is being held in place for no reason. He feels we are trying to drain his energy with the amount of blood we have drawn. His affect is bizarre and labile alternating from euphoric to irritable to tearful. He does admit to having a "mental breakdown" and calling the police. However, states he is fine now and ready to go home. He admits to feeling anxious being away from his mother and twin sister. When one one one discussed the possibility of mood disorder such as bipolar disorder, and patient becomes extremely angry. He is unreceptive to hearing possible symptoms, and insists he is simply energetic because he was a Production designer, theatre/television/film at [employer]. He does admit to hearing he has a history of HIV and syphilis, but remains a poor historian for work up he has had in the past. Ultimately, he terminates interview and walks away from provider before medication options could be discussed. Will offer Abilify to patient for mood and psychosis. Will also have Zyprexa 5 mg PO or IM Q8hr PRN for acute agitation. Currently being follow by infectious disease for HIV and syphilis workup. Appreciate assistance.   Continued Clinical Symptoms:  Alcohol Use Disorder Identification Test Final Score (AUDIT): 2 The "Alcohol Use Disorders Identification Test", Guidelines  for Use in Primary Care, Second Edition.  World Science writer [facility]). Score between 0-7:  no or low risk or alcohol related problems. Score between 8-15:  moderate risk of alcohol related problems. Score between 16-19:  high risk of alcohol related problems. Score 20 or above:  warrants further diagnostic evaluation for alcohol dependence and treatment.   CLINICAL FACTORS:   Severe Anxiety and/or Agitation Bipolar Disorder:   Mixed State Alcohol/Substance Abuse/Dependencies Currently Psychotic Unstable or Poor Therapeutic Relationship Medical Diagnoses and Treatments/Surgeries   Musculoskeletal: Strength & Muscle Tone: within normal limits Gait & Station: normal Patient leans: N/A  Psychiatric Specialty Exam: Physical Exam Vitals and nursing note reviewed.  Constitutional:      Appearance: Normal appearance.  HENT:     Head: Normocephalic and atraumatic.     Right Ear: External ear normal.     Left Ear: External ear normal.     Nose: Nose normal.     Mouth/Throat:     Mouth: Mucous membranes are moist.     Pharynx: Oropharynx is clear.  Eyes:     Extraocular Movements: Extraocular movements intact.     Conjunctiva/sclera: Conjunctivae normal.     Pupils: Pupils are equal, round, and reactive to light.  Cardiovascular:     Rate and Rhythm: Normal rate.     Pulses: Normal pulses.  Pulmonary:     Effort: Pulmonary effort is normal.     Breath sounds: Normal breath sounds.  Abdominal:     General: Abdomen is flat.     Palpations: Abdomen is soft.  Musculoskeletal:        General:  No swelling. Normal range of motion.     Cervical back: Normal range of motion and neck supple.  Skin:    General: Skin is warm and dry.  Neurological:     General: No focal deficit present.     Mental Status: He is alert and oriented to person, place, and time.  Psychiatric:        Attention and Perception: He is inattentive.        Mood and Affect: Mood is anxious. Affect is  labile.        Speech: Speech is rapid and pressured.        Behavior: Behavior is agitated.        Thought Content: Thought content is paranoid.        Cognition and Memory: Cognition is impaired. Memory is impaired.        Judgment: Judgment is impulsive.     Review of Systems  Constitutional: Negative for appetite change and fatigue.  HENT: Negative for rhinorrhea and sore throat.   Eyes: Negative for photophobia and visual disturbance.  Respiratory: Negative for cough and shortness of breath.   Cardiovascular: Negative for chest pain and palpitations.  Gastrointestinal: Negative for constipation, diarrhea, nausea and vomiting.  Endocrine: Negative for cold intolerance and heat intolerance.  Genitourinary: Negative for difficulty urinating and dysuria.  Musculoskeletal: Negative for arthralgias and back pain.  Skin: Negative for rash and wound.  Allergic/Immunologic: Negative for environmental allergies and food allergies.  Neurological: Negative for dizziness and headaches.  Hematological: Negative for adenopathy. Does not bruise/bleed easily.  Psychiatric/Behavioral: Positive for behavioral problems and sleep disturbance. The patient is nervous/anxious.     Blood pressure 110/82, pulse 87, temperature 98.2 F (36.8 C), temperature source Oral, resp. rate 17, height 5\' 7"  (1.702 m), weight 58.1 kg, SpO2 99 %.Body mass index is 20.05 kg/m.  General Appearance: Fairly Groomed  Eye Contact:  Good  Speech:  Pressured  Volume:  Normal  Mood:  alternating from euphoric to irritable   Affect:  Inappropriate, Labile and Tearful  Thought Process:  Disorganized  Orientation:  Full (Time, Place, and Person)  Thought Content:  Illogical and Paranoid Ideation  Suicidal Thoughts:  No  Homicidal Thoughts:  No  Memory:  Immediate;   Poor Recent;   Poor Remote;   Poor  Judgement:  Impaired  Insight:  Lacking  Psychomotor Activity:  Restlessness  Concentration:  Concentration: Poor and  Attention Span: Poor  Recall:  Poor  Fund of Knowledge:  Fair  Language:  Good  Akathisia:  Negative  Handed:  Right  AIMS (if indicated):     Assets:  Communication Skills Desire for Improvement Housing Resilience Social Support  ADL's:  Intact  Cognition:  Impaired,  Mild  Sleep:         COGNITIVE FEATURES THAT CONTRIBUTE TO RISK:  Closed-mindedness and Loss of executive function    SUICIDE RISK:   Moderate:  Frequent suicidal ideation with limited intensity, and duration, some specificity in terms of plans, no associated intent, good self-control, limited dysphoria/symptomatology, some risk factors present, and identifiable protective factors, including available and accessible social support.  PLAN OF CARE: Continue involuntary committment. Will offer Abilify 15 mg daily for mood and psychosis. Zyprexa 5 mg PO or IM Q8hr PRN for acute agitation. Appreciate assistance of infectious disease for HIV and syphilis workup and medication recommendations.   I certify that inpatient services furnished can reasonably be expected to improve the patient's condition.    Karlene Lineman, MD 05/18/2020, 10:29 AM

## 2020-05-18 NOTE — Progress Notes (Signed)
Pt has been calmer this afternoon and easier to redirect. He took his p.o. and  IM medications. He has attended groups, eats with the other staff, but minimal interaction. He circled coping strategies that he could used to decrease his anxiety. Paranoid at times, making statements like . "Why are they taking blood from me,.You know why, what are you all trying to do to me. He is suspicious about what and why he is here, and wants to know exactly what we are doing. He took a nap this afternoon and presented much calmer, with less pressured speech. Dr. Neale Burly aware of his behavior and suspiciousness. I did not address his HIV status at this time, due to  the irritability he has shown today.

## 2020-05-18 NOTE — Progress Notes (Signed)
ID Was not able to reach health dept regarding confirmation of syphilis Rx But with a four fold decrease in tite in  2years very likely he was treated IF he sis till here on Monday I will verify If he is going to discharged  this weekend please enter a reliable number for [place] to reach him for engaging him in HIV care at [facility]

## 2020-05-18 NOTE — Tx Team (Signed)
Interdisciplinary Treatment and Diagnostic Plan Update  05/18/2020 Time of Session: 9:00AM Christopher Morgan MRN: [id number]  Principal Diagnosis: Psychosis [facility])  Secondary Diagnoses: Principal Problem:   Psychosis (HCC) Active Problems:   HIV positive (HCC)   History of syphilis   Current Medications:  Current Facility-Administered Medications  Medication Dose Route Frequency Provider Last Rate Last Admin  . acetaminophen (TYLENOL) tablet 650 mg  650 mg Oral Q6H PRN Clapacs, John T, MD      . alum & mag hydroxide-simeth (MAALOX/MYLANTA) 200-200-20 MG/5ML suspension 30 mL  30 mL Oral Q4H PRN Clapacs, John T, MD      . ARIPiprazole (ABILIFY) tablet 15 mg  15 mg Oral Daily [name], MD      . hydrOXYzine (ATARAX/VISTARIL) tablet 50 mg  50 mg Oral TID PRN Clapacs, [name], MD   50 mg at 05/17/20 2126  . influenza vac split quadrivalent PF (FLUARIX) injection 0.5 mL  0.5 mL Intramuscular Tomorrow-1000 [name], MD      . magnesium hydroxide (MILK OF MAGNESIA) suspension 30 mL  30 mL Oral Daily PRN Clapacs, John T, MD      . OLANZapine (ZYPREXA) tablet 5 mg  5 mg Oral Q8H PRN [name], MD       Or  . OLANZapine (ZYPREXA) injection 5 mg  5 mg Intramuscular Q8H PRN [name], MD      . pneumococcal 23 valent vaccine (PNEUMOVAX-23) injection 0.5 mL  0.5 mL Intramuscular Tomorrow-1000 [name], MD      . traZODone (DESYREL) tablet 100 mg  100 mg Oral QHS PRN Clapacs, [name], MD   100 mg at 05/17/20 2126   PTA Medications: Medications Prior to Admission  Medication Sig Dispense Refill Last Dose  . azithromycin (ZITHROMAX Z-PAK) 250 MG tablet Take 2 tablets (500 mg) on  Day 1,  followed by 1 tablet (250 mg) once daily on Days 2 through 5. (Patient not taking: Reported on 05/16/2020) 6 each 0   . brompheniramine-pseudoephedrine-DM 30-2-10 MG/5ML syrup Take 10 mLs by mouth 4 (four) times daily as needed. (Patient not taking: Reported on 05/16/2020) 200 mL 0   .  cephALEXin (KEFLEX) 500 MG capsule Take 1 capsule (500 mg total) by mouth 3 (three) times daily. (Patient not taking: Reported on 05/16/2020) 21 capsule 0   . HYDROcodone-acetaminophen (NORCO) 5-325 MG tablet Take 1 tablet by mouth every 6 (six) hours as needed for moderate pain. (Patient not taking: Reported on 05/16/2020) 15 tablet 0   . ibuprofen (ADVIL,MOTRIN) 600 MG tablet Take 1 tablet (600 mg total) by mouth every 8 (eight) hours as needed. (Patient not taking: Reported on 05/16/2020) 15 tablet 0   . traMADol (ULTRAM) 50 MG tablet Take 1 tablet (50 mg total) by mouth every 6 (six) hours as needed for moderate pain. (Patient not taking: Reported on 05/16/2020) 12 tablet 0     Patient Stressors: Financial difficulties Substance abuse  Patient Strengths: Wellsite geologist fund of knowledge Supportive family/friends Work skills  Treatment Modalities: Medication Management, Group therapy, Case management,  1 to 1 session with clinician, Psychoeducation, Recreational therapy.   Physician Treatment Plan for Primary Diagnosis: Psychosis (HCC) Long Term Goal(s): Improvement in symptoms so as ready for discharge Improvement in symptoms so as ready for discharge   Short Term Goals: Ability to identify changes in lifestyle to reduce recurrence of condition will improve Ability to verbalize feelings will improve Ability to demonstrate self-control will improve  Ability to identify and develop effective coping behaviors will improve Compliance with prescribed medications will improve Ability to identify triggers associated with substance abuse/mental health issues will improve Ability to identify changes in lifestyle to reduce recurrence of condition will improve Compliance with prescribed medications will improve  Medication Management: Evaluate patient's response, side effects, and tolerance of medication regimen.  Therapeutic Interventions: 1 to 1 sessions, Unit Group sessions  and Medication administration.  Evaluation of Outcomes: Not Progressing  Physician Treatment Plan for Secondary Diagnosis: Principal Problem:   Psychosis (HCC) Active Problems:   HIV positive (HCC)   History of syphilis  Long Term Goal(s): Improvement in symptoms so as ready for discharge Improvement in symptoms so as ready for discharge   Short Term Goals: Ability to identify changes in lifestyle to reduce recurrence of condition will improve Ability to verbalize feelings will improve Ability to demonstrate self-control will improve Ability to identify and develop effective coping behaviors will improve Compliance with prescribed medications will improve Ability to identify triggers associated with substance abuse/mental health issues will improve Ability to identify changes in lifestyle to reduce recurrence of condition will improve Compliance with prescribed medications will improve     Medication Management: Evaluate patient's response, side effects, and tolerance of medication regimen.  Therapeutic Interventions: 1 to 1 sessions, Unit Group sessions and Medication administration.  Evaluation of Outcomes: Not Progressing   RN Treatment Plan for Primary Diagnosis: Psychosis (HCC) Long Term Goal(s): Knowledge of disease and therapeutic regimen to maintain health will improve  Short Term Goals: Ability to verbalize frustration and anger appropriately will improve, Ability to demonstrate self-control, Ability to participate in decision making will improve, Ability to verbalize feelings will improve, Ability to identify and develop effective coping behaviors will improve and Compliance with prescribed medications will improve  Medication Management: RN will administer medications as ordered by provider, will assess and evaluate patient's response and provide education to patient for prescribed medication. RN will report any adverse and/or side effects to prescribing  provider.  Therapeutic Interventions: 1 on 1 counseling sessions, Psychoeducation, Medication administration, Evaluate responses to treatment, Monitor vital signs and CBGs as ordered, Perform/monitor CIWA, COWS, AIMS and Fall Risk screenings as ordered, Perform wound care treatments as ordered.  Evaluation of Outcomes: Not Progressing   LCSW Treatment Plan for Primary Diagnosis: Psychosis (HCC) Long Term Goal(s): Safe transition to appropriate next level of care at discharge, Engage patient in therapeutic group addressing interpersonal concerns.  Short Term Goals: Engage patient in aftercare planning with referrals and resources, Increase social support, Increase ability to appropriately verbalize feelings, Increase emotional regulation, Facilitate acceptance of mental health diagnosis and concerns, Identify triggers associated with mental health/substance abuse issues and Increase skills for wellness and recovery  Therapeutic Interventions: Assess for all discharge needs, 1 to 1 time with Social worker, Explore available resources and support systems, Assess for adequacy in community support network, Educate family and significant other(s) on suicide prevention, Complete Psychosocial Assessment, Interpersonal group therapy.  Evaluation of Outcomes: Not Progressing   Progress in Treatment: Attending groups: No. Participating in groups: No. Taking medication as prescribed: Yes. Toleration medication: Yes. Family/Significant other contact made: No, will contact:  when given permission. Patient understands diagnosis: No. Discussing patient identified problems/goals with staff: Yes. Medical problems stabilized or resolved: Yes. Denies suicidal/homicidal ideation: Yes. Issues/concerns per patient self-inventory: No. Other: None.  New problem(s) identified: No, Describe:  none.  New Short Term/Long Term Goal(s): elimination of symptoms of mania, medication management for mood stabilization;  development of comprehensive mental wellness plan.  Patient Goals: "I miss my mother and my sister. I would like to go home."   Discharge Plan or Barriers: Patient to discharge home with follow up through [facility].   Reason for Continuation of Hospitalization: [place] Medical Issues Medication stabilization  Estimated Length of Stay: 1-7 days  Attendees: Patient: Christopher Morgan 05/18/2020 10:54 AM  Physician: Les Pou, MD 05/18/2020 10:54 AM  Nursing: Doyce Para, RN 05/18/2020 10:54 AM  RN Care Manager: 05/18/2020 10:54 AM  Social Worker: Penni Homans, MSW, LCSW 05/18/2020 10:54 AM  Recreational Therapist: Garret Reddish, Drue Flirt, LRT 05/18/2020 10:54 AM  Other: Vilma Meckel. Algis Greenhouse, MSW, [place], LCAS 05/18/2020 10:54 AM  Other:  05/18/2020 10:54 AM  Other: 05/18/2020 10:54 AM    Scribe for Treatment Team: Glenis Smoker, LCSW 05/18/2020 10:54 AM

## 2020-05-18 NOTE — Progress Notes (Signed)
Pt seem on the phone at the beginning of the shift. I introduced myself to him as his nurse for today. He denied SI/HI/AVH. Pt stated, "I was on the phone talking to my family, because I am going home and they should be coming up here to see me. I'm fine  I need my family, I miss my family and I just wanted you to know that. Ok, thank you for talking me. OK." Pt has pressured speech, sounding like a broken record and gets louder and louder while talking. Pt often  comes to the nursing station to explain himself. He needs lots of support, and reassurance. In team meeting he continued to talk at a fast pace while trying to explain himself, his was very emotional, loud, and talking nonstop. He had 1:1 with the doctor, but he left the 1:1 because she was talking to him about being bipolar.  This information made him very agitated. He came to the nursing station during his  1:1 with the doctor, stating he was not bipolar and he did not need to be here. He stated he did not want to talk anymore and walked away. He was very  hard to redirect, but he finally went to his room.

## 2020-05-18 NOTE — Plan of Care (Signed)
  Problem: Education: Goal: Knowledge of [facility] Education information/materials will improve Outcome: Progressing   Problem: Health Behavior/Discharge Planning: Goal: Identification of resources available to assist in meeting health care needs will improve Outcome: Progressing Goal: Compliance with treatment plan for underlying cause of condition will improve Outcome: Progressing   Problem: Safety: Goal: Periods of time without injury will increase Outcome: Progressing   Problem: Coping: Goal: Coping ability will improve Outcome: Progressing Goal: Will verbalize feelings Outcome: Progressing   Problem: Self-Concept: Goal: Level of anxiety will decrease Outcome: Progressing   Problem: Education: Goal: Will be free of psychotic symptoms Outcome: Progressing Goal: Knowledge of the prescribed therapeutic regimen will improve Outcome: Progressing   Problem: Health Behavior/Discharge Planning: Goal: Compliance with prescribed medication regimen will improve Outcome: Progressing   

## 2020-05-19 DIAGNOSIS — F29 Unspecified psychosis not due to a substance or known physiological condition: Secondary | ICD-10-CM

## 2020-05-19 LAB — HIV-1 RNA QUANT-NO REFLEX-BLD
HIV 1 RNA Quant: 1450 copies/mL
LOG10 HIV-1 RNA: 3.161 log10copy/mL

## 2020-05-19 NOTE — Progress Notes (Addendum)
[facility] MD Progress Note  05/19/2020 11:04 AM Christopher Morgan  MRN:  [id number]  Principal Problem: Psychosis [facility]) Diagnosis: Principal Problem:   Psychosis (HCC) Active Problems:   HIV positive (HCC)   History of syphilis  Total Time spent with patient: 20 minutes  Christopher Morgan is a  24y.o. male who presents to the [facility] unit for treatment of psychosis.   Interval History Patient was seen today for re-evaluation.  Nursing reports no events overnight. The patient has no issues with performing ADLs.  Patient has been medication compliant.    Subjective:  On assessment patient is flirtatious initially, then progressively guarded and paranoid within the duration of the interview. He reports he had a "mental breakdown" that now "resolved completely" and thinks he is ready for discharge. Gets upset when I told his we are not planning to  Discharge him over the weekend. He expresses paranoid thoughts about "nurses watching patients and filling menus without asking patients", that nurses talk about patients, that I ask him questions and put notes on paper; he is paranoid that we will use information against him; says "something is weird here". Reports he has no side effects after he took Abilify twice, Reports feeling good after yesterdays vaccines. Current suicidal/homicidal ideations: Denies Current auditory/visual hallucinations: Denies The patient reports no side effects from medications.    Labs: no new results for review.     Past Psychiatric History: see H&P  Past Medical History: History reviewed. No pertinent past medical history. History reviewed. No pertinent surgical history. Family History: History reviewed. No pertinent family history. Family Psychiatric  History: see H&P Social History:  Social History   Substance and Sexual Activity  Alcohol Use No     Social History   Substance and Sexual Activity  Drug Use No    Social History   Socioeconomic History  . Marital status:  Single    Spouse name: Not on file  . Number of children: Not on file  . Years of education: Not on file  . Highest education level: Not on file  Occupational History  . Not on file  Tobacco Use  . Smoking status: Current Every Day Smoker    Packs/day: 0.03    Types: Cigarettes  . Smokeless tobacco: Never Used  Substance and Sexual Activity  . Alcohol use: No  . Drug use: No  . Sexual activity: Not on file  Other Topics Concern  . Not on file  Social History Narrative  . Not on file   Social Determinants of Health   Financial Resource Strain:   . Difficulty of Paying Living Expenses: Not on file  Food Insecurity:   . Worried About Programme researcher, broadcasting/film/video in the Last Year: Not on file  . Ran Out of Food in the Last Year: Not on file  Transportation Needs:   . Lack of Transportation (Medical): Not on file  . Lack of Transportation (Non-Medical): Not on file  Physical Activity:   . Days of Exercise per Week: Not on file  . Minutes of Exercise per Session: Not on file  Stress:   . Feeling of Stress : Not on file  Social Connections:   . Frequency of Communication with Friends and Family: Not on file  . Frequency of Social Gatherings with Friends and Family: Not on file  . Attends Religious Services: Not on file  . Active Member of Clubs or Organizations: Not on file  . Attends Banker Meetings: Not on file  .  Marital Status: Not on file   Additional Social History:                         Sleep: Fair  Appetite:  Fair  Current Medications: Current Facility-Administered Medications  Medication Dose Route Frequency Provider Last Rate Last Admin  . acetaminophen (TYLENOL) tablet 650 mg  650 mg Oral Q6H PRN Clapacs, John T, MD      . alum & mag hydroxide-simeth (MAALOX/MYLANTA) 200-200-20 MG/5ML suspension 30 mL  30 mL Oral Q4H PRN Clapacs, John T, MD      . ARIPiprazole (ABILIFY) tablet 15 mg  15 mg Oral Daily [name], MD   15 mg at 05/19/20  7425  . hydrOXYzine (ATARAX/VISTARIL) tablet 50 mg  50 mg Oral TID PRN Clapacs, [name], MD   50 mg at 05/17/20 2126  . magnesium hydroxide (MILK OF MAGNESIA) suspension 30 mL  30 mL Oral Daily PRN Clapacs, John T, MD      . OLANZapine (ZYPREXA) tablet 5 mg  5 mg Oral Q8H PRN [name], MD   5 mg at 05/18/20 1706   Or  . OLANZapine (ZYPREXA) injection 5 mg  5 mg Intramuscular Q8H PRN [name], MD      . traZODone (DESYREL) tablet 100 mg  100 mg Oral QHS PRN Clapacs, [name], MD   100 mg at 05/17/20 2126    Lab Results:  Results for orders placed or performed during the hospital encounter of 05/17/20 (from the past 48 hour(s))  Lipid panel     Status: None   Collection Time: 05/18/20 11:22 AM  Result Value Ref Range   Cholesterol 131 0 - 200 mg/dL   Triglycerides 956 <387 mg/dL   HDL 51 >56 mg/dL   Total CHOL/HDL Ratio 2.6 RATIO   VLDL 21 0 - 40 mg/dL   LDL Cholesterol 59 0 - 99 mg/dL    Comment:        Total Cholesterol/HDL:CHD Risk Coronary Heart Disease Risk Table                     Men   Women  1/2 Average Risk   3.4   3.3  Average Risk       5.0   4.4  2 X Average Risk   9.6   7.1  3 X Average Risk  23.4   11.0        Use the calculated Patient Ratio above and the CHD Risk Table to determine the patient's CHD Risk.        ATP III CLASSIFICATION (LDL):  <100     mg/dL   Optimal  433-295  mg/dL   Near or Above                    Optimal  130-159  mg/dL   Borderline  188-416  mg/dL   High  >606     mg/dL   Very High Performed at [facility], [address] [place], [place] [address]   Hemoglobin A1c     Status: None   Collection Time: 05/18/20 11:22 AM  Result Value Ref Range   Hgb A1c MFr Bld 5.0 4.8 - 5.6 %    Comment: (NOTE)         Prediabetes: 5.7 - 6.4         Diabetes: >6.4  Glycemic control for adults with diabetes: <7.0    Mean Plasma Glucose 97 mg/dL    Comment: (NOTE) Performed At: [facility] [address]  [place], [place] [id number] Jolene Schimke MD JJ:[id number]     Blood Alcohol level:  Lab Results  Component Value Date   [facility] <10 05/16/2020    Metabolic Disorder Labs: Lab Results  Component Value Date   HGBA1C 5.0 05/18/2020   MPG 97 05/18/2020   No results found for: PROLACTIN Lab Results  Component Value Date   CHOL 131 05/18/2020   TRIG 105 05/18/2020   HDL 51 05/18/2020   CHOLHDL 2.6 05/18/2020   VLDL 21 05/18/2020   LDLCALC 59 05/18/2020    Physical Findings: AIMS:  , ,  ,  ,    CIWA:    COWS:     Musculoskeletal: Strength & Muscle Tone: within normal limits Gait & Station: normal Patient leans: N/A  Psychiatric Specialty Exam: Physical Exam  Review of Systems  Blood pressure 111/80, pulse 75, temperature 98.3 F (36.8 C), temperature source Oral, resp. rate 18, height 5\' 7"  (1.702 m), weight 58.1 kg, SpO2 100 %.Body mass index is 20.05 kg/m.  General Appearance: Bizarre  Eye Contact:  Good  Speech:  Slow  Volume:  Normal  Mood:  Dysphoric  Affect:  Labile  Thought Process:  Disorganized  Orientation:  Full (Time, Place, and Person)  Thought Content:  Illogical, Delusions and Paranoid Ideation  Suicidal Thoughts:  No  Homicidal Thoughts:  No  Memory:  Immediate;   Fair Recent;   Fair Remote;   NA  Judgement:  Impaired  Insight:  Lacking  Psychomotor Activity:  Normal  Concentration:  Concentration: Fair and Attention Span: Fair  Recall:  of Knowledge:  Fair  Language:  Fair  Akathisia:  No  Handed:  Right  AIMS (if indicated):     Assets:  Communication Skills Desire for Improvement  ADL's:  Intact  Cognition:  Impaired,  Mild  Sleep:  Number of Hours: 4.5     Treatment Plan Summary: Daily contact with patient to assess and evaluate symptoms and progress in treatment and Medication management Patient is a 24 year old male/male with the above-stated past psychiatric history who is seen in follow-up.  Chart reviewed.  Patient discussed with nursing. Patient is calm, not agitated, although remains delusional, paranoid. Will continue medications without changes today, will consider SGA dose increase tomorrow if no improvement.   Plan: -continue inpatient psych admission; 15-minute checks; daily contact with patient to assess and evaluate symptoms and progress in treatment; psychoeducation.  -continue scheduled medications: . ARIPiprazole  15 mg Oral Daily   -continue PRN medications.  acetaminophen, alum & mag hydroxide-simeth, hydrOXYzine, magnesium hydroxide, OLANZapine **OR** OLANZapine, traZODone   -Pertinent Labs: no new labs ordered today  -EKG: on 10/28 showed Qtc 385 with normal sinus rhythm    -Consults: patient followed by ID consult service; he needs to be set up for HIV care at [facility] center prior to discharge.   -Disposition: patient is not ready for discharge yet. -  I certify that the patient does need, on a daily basis, active treatment furnished directly by or requiring the supervision of inpatient psychiatric facility personnel.   [facility], MD 05/19/2020, 11:04 AM

## 2020-05-19 NOTE — B[facility] Group Notes (Signed)
  [facility]/BM[facility] LCSW Group Therapy Note  Date/Time:  05/19/2020 1:00 PM-2:10 PM   Type of Therapy and Topic:  Group Therapy:  Feelings About Hospitalization  Participation Level:  Did Not Attend   Description of Group This process group involved patients discussing their feelings related to being hospitalized, as well as the benefits they see to being in the hospital.  These feelings and benefits were itemized.  The group then brainstormed specific ways in which they could seek those same benefits when they discharge and return home.  Therapeutic Goals 1. Patient will identify and describe positive and negative feelings related to hospitalization 2. Patient will verbalize benefits of hospitalization to themselves personally 3. Patients will brainstorm together ways they can obtain similar benefits in the outpatient setting, identify barriers to wellness and possible solutions  Summary of Patient Progress:  Did not attend   Therapeutic Modalities Cognitive Behavioral Therapy Motivational Interviewing    Susa Simmonds, LCSWA 05/19/2020  4:21 PM

## 2020-05-19 NOTE — Plan of Care (Signed)
  Problem: Education: Goal: Knowledge of [facility] Education information/materials will improve Outcome: Progressing   Problem: Health Behavior/Discharge Planning: Goal: Identification of resources available to assist in meeting health care needs will improve Outcome: Progressing Goal: Compliance with treatment plan for underlying cause of condition will improve Outcome: Progressing   Problem: Safety: Goal: Periods of time without injury will increase Outcome: Progressing   Problem: Coping: Goal: Coping ability will improve Outcome: Progressing Goal: Will verbalize feelings Outcome: Progressing   Problem: Self-Concept: Goal: Level of anxiety will decrease Outcome: Progressing   Problem: Education: Goal: Will be free of psychotic symptoms Outcome: Progressing Goal: Knowledge of the prescribed therapeutic regimen will improve Outcome: Progressing   Problem: Health Behavior/Discharge Planning: Goal: Compliance with prescribed medication regimen will improve Outcome: Progressing   

## 2020-05-19 NOTE — Progress Notes (Signed)
Patient has been pleasant and cooperative. Denies SI, HI and AVH 

## 2020-05-19 NOTE — Plan of Care (Signed)
Patient remains labile and preoccupied. Intrusive and pacing in the hallway, seeking attention. He denies SI/HI. Denies hallucinations. Patient is manic and frequently redirected  but has no aggressive behavior. Patient has no major concern so far. Safety monitored as expected.

## 2020-05-19 NOTE — BHH Suicide Risk Assessment (Addendum)
B[facility] INPATIENT:  Family/Significant Other Suicide Prevention Education  Suicide Prevention Education:  Education Completed; [place], [phone],  (Mother) has been identified by the patient as the family member/significant other with whom the patient will be residing, and identified as the person(s) who will aid the patient in the event of a mental health crisis (suicidal ideations/suicide attempt).  With written consent from the patient, the family member/significant other has been provided the following suicide prevention education, prior to the and/or following the discharge of the patient.  The suicide prevention education provided includes the following:  Suicide risk factors  Suicide prevention and interventions  National Suicide Hotline telephone number  [facility] assessment telephone number  [facility] Emergency Assistance 911  [facility] and/or Residential Mobile Crisis Unit telephone number  Request made of family/significant other to:  Remove weapons (e.g., guns, rifles, knives), all items previously/currently identified as safety concern.    Remove drugs/medications (over-the-counter, prescriptions, illicit drugs), all items previously/currently identified as a safety concern.  The family member/significant other verbalizes understanding of the suicide prevention education information provided.  The family member/significant other agrees to remove the items of safety concern listed above.  Christopher Morgan  Christopher Morgan 05/19/2020, 5:30 PM

## 2020-05-20 NOTE — BHH Group Notes (Signed)
LCSW Group Therapy Note   05/20/2020 1:05 PM- 2:00 PM    Type of Therapy and Topic: Group Therapy: Fears and Unhealthy Coping Skills   Participation Level: Did Not Attend   Description of Group: The focus of this group was to discuss some of the prevalent fears that patients experience, and to identify the commonalities among group members. An exercise was used to initiate the discussion, followed by writing on the white board a group-generated list of unhealthy coping and healthy coping techniques to deal with each fear.   Therapeutic Goals: 1. Patient will identify and describe 3 fears they experience 2. Patient will identify one positive coping strategy for each fear they experience 3. Patient will respond empathically to peers statements regarding fears they experience   Summary of Patient Progress: Did not Attend     Therapeutic Modalities Cognitive Behavioral Therapy Motivational Interviewing    Susa Simmonds, LCSWA 05/20/2020 2:40 PM

## 2020-05-20 NOTE — Progress Notes (Signed)
Patient alert and oriented x 4, affect is flat but he brightens upon approach , he is calm, and receptive to staff, he denies SI/HI/AVH, he is complaint with medication regimen, 15 minutes safety checks maitained will contijnue to monitor.

## 2020-05-20 NOTE — Plan of Care (Signed)
D- Patient alert and oriented. Patient presented in a drowsy, but pleasant mood on assessment stating he slept good last night and had no complaints to voice to this Clinical research associate. Patient denied SI, HI, AVH, and pain at this time. Patient also denied any signs/symptoms of depression/anxiety. Patient's goal for today is "going home to family, miss them a lot", in which he will "talk to doctor and mom and sister", in order to achieve his goal. Patient has been sleeping most of the day, however, he has reported to this writer that he has been feeling extremely tired.  A- Scheduled medications administered to patient, per MD orders. Support and encouragement provided.  Routine safety checks conducted every 15 minutes.  Patient informed to notify staff with problems or concerns.  R- No adverse drug reactions noted. Patient contracts for safety at this time. Patient compliant with medications. Patient receptive, calm, and cooperative. Patient interacts well with others on the unit.  Patient remains safe at this time.  Problem: Education: Goal: Knowledge of [facility] Education information/materials will improve Outcome: Progressing   Problem: Health Behavior/Discharge Planning: Goal: Identification of resources available to assist in meeting health care needs will improve Outcome: Progressing Goal: Compliance with treatment plan for underlying cause of condition will improve Outcome: Progressing   Problem: Safety: Goal: Periods of time without injury will increase Outcome: Progressing   Problem: Coping: Goal: Coping ability will improve Outcome: Progressing Goal: Will verbalize feelings Outcome: Progressing   Problem: Self-Concept: Goal: Level of anxiety will decrease Outcome: Progressing   Problem: Education: Goal: Will be free of psychotic symptoms Outcome: Progressing Goal: Knowledge of the prescribed therapeutic regimen will improve Outcome: Progressing   Problem: Health  Behavior/Discharge Planning: Goal: Compliance with prescribed medication regimen will improve Outcome: Progressing

## 2020-05-20 NOTE — Progress Notes (Signed)
[facility] MD Progress Note  05/20/2020 12:06 PM Christopher Morgan  MRN:  [id number]  Principal Problem: Psychosis [facility]) Diagnosis: Principal Problem:   Psychosis (HCC) Active Problems:   HIV positive (HCC)   History of syphilis  Total Time spent with patient: 20 minutes  [name] is a  24y.o. male who presents to the [facility] unit for treatment of psychosis.   Interval History Patient was seen today for re-evaluation.  Nursing reports no events overnight. The patient has no issues with performing ADLs.  Patient has been medication compliant.    Subjective:  Patient seems to be in good spirit today. He reports "feeling good", says he likes "this place" and admits that he was "not himself" on admission and now is "closer to normal self", states "I like this medication you giving me". He does not express paranoid thoughts today. Current suicidal/homicidal ideations: Denies Current auditory/visual hallucinations: Denies The patient reports no side effects from medications.    Labs: no new results for review.   Past Psychiatric History: see H&P  Past Medical History: History reviewed. No pertinent past medical history. History reviewed. No pertinent surgical history. Family History: History reviewed. No pertinent family history. Family Psychiatric  History: see H&P Social History:  Social History   Substance and Sexual Activity  Alcohol Use No     Social History   Substance and Sexual Activity  Drug Use No    Social History   Socioeconomic History  . Marital status: Single    Spouse name: Not on file  . Number of children: Not on file  . Years of education: Not on file  . Highest education level: Not on file  Occupational History  . Not on file  Tobacco Use  . Smoking status: Current Every Day Smoker    Packs/day: 0.03    Types: Cigarettes  . Smokeless tobacco: Never Used  Substance and Sexual Activity  . Alcohol use: No  . Drug use: No  . Sexual activity: Not on file  Other  Topics Concern  . Not on file  Social History Narrative  . Not on file   Social Determinants of Health   Financial Resource Strain:   . Difficulty of Paying Living Expenses: Not on file  Food Insecurity:   . Worried About Programme researcher, broadcasting/film/video in the Last Year: Not on file  . Ran Out of Food in the Last Year: Not on file  Transportation Needs:   . Lack of Transportation (Medical): Not on file  . Lack of Transportation (Non-Medical): Not on file  Physical Activity:   . Days of Exercise per Week: Not on file  . Minutes of Exercise per Session: Not on file  Stress:   . Feeling of Stress : Not on file  Social Connections:   . Frequency of Communication with Friends and Family: Not on file  . Frequency of Social Gatherings with Friends and Family: Not on file  . Attends Religious Services: Not on file  . Active Member of Clubs or Organizations: Not on file  . Attends Banker Meetings: Not on file  . Marital Status: Not on file   Additional Social History:                         Sleep: Fair  Appetite:  Fair  Current Medications: Current Facility-Administered Medications  Medication Dose Route Frequency Provider Last Rate Last Admin  . acetaminophen (TYLENOL) tablet 650 mg  650  mg Oral Q6H PRN Clapacs, John T, MD      . alum & mag hydroxide-simeth (MAALOX/MYLANTA) 200-200-20 MG/5ML suspension 30 mL  30 mL Oral Q4H PRN Clapacs, John T, MD      . ARIPiprazole (ABILIFY) tablet 15 mg  15 mg Oral Daily [name], MD   15 mg at 05/20/20 0848  . hydrOXYzine (ATARAX/VISTARIL) tablet 50 mg  50 mg Oral TID PRN Clapacs, [name], MD   50 mg at 05/19/20 2127  . magnesium hydroxide (MILK OF MAGNESIA) suspension 30 mL  30 mL Oral Daily PRN Clapacs, John T, MD      . OLANZapine (ZYPREXA) tablet 5 mg  5 mg Oral Q8H PRN [name], MD   5 mg at 05/18/20 1706   Or  . OLANZapine (ZYPREXA) injection 5 mg  5 mg Intramuscular Q8H PRN [name], MD      .  traZODone (DESYREL) tablet 100 mg  100 mg Oral QHS PRN Clapacs, [name], MD   100 mg at 05/19/20 2127    Lab Results:  No results found for this or any previous visit (from the past 48 hour(s)).  Blood Alcohol level:  Lab Results  Component Value Date   ETH <10 05/16/2020    Metabolic Disorder Labs: Lab Results  Component Value Date   HGBA1C 5.0 05/18/2020   MPG 97 05/18/2020   No results found for: PROLACTIN Lab Results  Component Value Date   CHOL 131 05/18/2020   TRIG 105 05/18/2020   HDL 51 05/18/2020   CHOLHDL 2.6 05/18/2020   VLDL 21 05/18/2020   LDLCALC 59 05/18/2020    Physical Findings: AIMS:  , ,  ,  ,    CIWA:    COWS:     Musculoskeletal: Strength & Muscle Tone: within normal limits Gait & Station: normal Patient leans: N/A  Psychiatric Specialty Exam: Physical Exam   Review of Systems   Blood pressure 112/71, pulse 63, temperature 98.1 F (36.7 C), temperature source Oral, resp. rate 18, height 5\' 7"  (1.702 m), weight 58.1 kg, SpO2 100 %.Body mass index is 20.05 kg/m.  General Appearance: Bizarre  Eye Contact:  Good  Speech: wnl  Volume:  Normal  Mood:  euthymic  Affect:  appropriate  Thought Process: better organized, goal-directed  Orientation:  Full (Time, Place, and Person)  Thought Content:  He did not express delusions during the interview   Suicidal Thoughts:  No  Homicidal Thoughts:  No  Memory:  Immediate;   Fair Recent;   Fair Remote;   NA  Judgement: limited, improving  Insight:  Limited, improving  Psychomotor Activity:  Normal  Concentration:  Concentration: Fair and Attention Span: Fair  Recall:  of Knowledge:  Fair  Language:  Fair  Akathisia:  No  Handed:  Right  AIMS (if indicated):     Assets:  Communication Skills Desire for Improvement  ADL's:  Intact  Cognition:  Impaired,  Mild  Sleep:  Number of Hours: 3     Treatment Plan Summary: Daily contact with patient to assess and evaluate symptoms  and progress in treatment and Medication management Patient is a 24 year old male/male with the above-stated past psychiatric history who is seen in follow-up.  Chart reviewed. Patient discussed with nursing. Patient is calm, not agitated, does not express delusional content on assessment today. Sleep remains poor at nighttime. Current antipsychotic seems to be effective, will continue medications without changes today; will continue  to observe patient`s behavior and thought content.   Plan: -continue inpatient psych admission; 15-minute checks; daily contact with patient to assess and evaluate symptoms and progress in treatment; psychoeducation.  -continue scheduled medications: . ARIPiprazole  15 mg Oral Daily   -continue PRN medications.  acetaminophen, alum & mag hydroxide-simeth, hydrOXYzine, magnesium hydroxide, OLANZapine **OR** OLANZapine, traZODone   -Pertinent Labs: no new labs ordered today  -EKG: on 10/28 showed Qtc 385 with normal sinus rhythm    -Consults: patient followed by ID consult service; he needs to be set up for HIV care at [facility] center prior to discharge.   -Disposition: patient is not ready for discharge yet. -  I certify that the patient does need, on a daily basis, active treatment furnished directly by or requiring the supervision of inpatient psychiatric facility personnel.   Thalia Party, MD 05/20/2020, 12:06 PM

## 2020-05-20 NOTE — Progress Notes (Signed)
Patient came to this Clinical research associate and stated "that medicine is working, I can say that I still get nervous at night, but I do feel much better".

## 2020-05-21 MED ORDER — RISPERIDONE 1 MG PO TABS
2.0000 mg | ORAL_TABLET | Freq: Every day | ORAL | Status: DC
  Administered 2020-05-21 – 2020-05-22 (×2): 2 mg via ORAL
  Filled 2020-05-21 (×2): qty 2

## 2020-05-21 MED ORDER — OLANZAPINE 10 MG IM SOLR
10.0000 mg | Freq: Three times a day (TID) | INTRAMUSCULAR | Status: DC | PRN
  Administered 2020-05-23: 10 mg via INTRAMUSCULAR
  Filled 2020-05-21: qty 10

## 2020-05-21 MED ORDER — OLANZAPINE 10 MG PO TABS: 10.0000 mg | ORAL_TABLET | Freq: Three times a day (TID) | ORAL | Status: DC | PRN

## 2020-05-21 NOTE — Progress Notes (Signed)
Patient presents pleasant and cooperative. Reports has been drowsy and sleeping all day. Patient attributes this to sleep aid he had on last pm. Pt did not want this sleep med tonight. Denies any SI, HI, AVH, but endorses anxiety. Prn given with good relief. Pt also questioned his infectious disease medications and when he would start taking them. Pt voiced no other complaints, noted in room sleeping off and on.  Encouragement and support provided. Safety checks maintained. Medications given as prescribed. Pt receptive and remains safe on unit with q 15 min checks.Marland Kitchen

## 2020-05-21 NOTE — Progress Notes (Signed)
Pharmacy - Antimicrobial Stewardship  [facility] Department to see if record of receiving treatment for syphilis.  They do not have any record of him receiving treatment or any recent visitis.   Juliette Alcide, PharmD, BCPS.   Work Cell: [phone] 05/21/2020 1:55 PM

## 2020-05-21 NOTE — Progress Notes (Signed)
Recreation Therapy Notes   Date: 05/21/2020  Time: 9:30 am   Location: Craft room   Behavioral response: Disruptive, Redirection needed   Intervention Topic: Necessities   Discussion/Intervention:  Group content on today was focused on necessities. The group defined necessities and how they determine their necessities. Individuals expressed how many necessities they have and if it changes from day to day. Patients described the difference between wants and needs. The group explained how they have overspent on wants in the past. Individuals described a reoccurring necessity for them. The intervention "What I need" helped patients differentiate between wants and needs.    Clinical Observations/Feedback: Patient came to group late and looked writer up and down and stated;  "so you have more than one job?, I just seen you take the trash out". "You are trying to kill me too?'" Patient was redirected by writer and invited to take a seat and join group. Patient took a seat and looked around at his peers and Clinical research associate and stated so yall all spies? How much are they paying yall, they must be paying yall good." "Yall must not know who I know." Patient eventually left group and did not return.     Christopher Morgan LRT/CTRS         Christopher  Morgan 05/21/2020 11:47 AM

## 2020-05-21 NOTE — [facility] Group Notes (Signed)
LCSW Group Therapy Note   05/21/2020 1:00 PM  Type of Therapy and Topic:  Group Therapy:  Overcoming Obstacles   Participation Level:  None   Description of Group:    In this group patients will be encouraged to explore what they see as obstacles to their own wellness and recovery. They will be guided to discuss their thoughts, feelings, and behaviors related to these obstacles. The group will process together ways to cope with barriers, with attention given to specific choices patients can make. Each patient will be challenged to identify changes they are motivated to make in order to overcome their obstacles. This group will be process-oriented, with patients participating in exploration of their own experiences as well as giving and receiving support and challenge from other group members.   Therapeutic Goals: 1. Patient will identify personal and current obstacles as they relate to admission. 2. Patient will identify barriers that currently interfere with their wellness or overcoming obstacles.  3. Patient will identify feelings, thought process and behaviors related to these barriers. 4. Patient will identify two changes they are willing to make to overcome these obstacles:      Summary of Patient Progress Patient was present in group. Patient did not engage in group discussions, however at the close of group he thanked CSW and reported that group was helpful.     Therapeutic Modalities:   Cognitive Behavioral Therapy Solution Focused Therapy Motivational Interviewing Relapse Prevention Therapy  Penni Homans, MSW, LCSW 05/21/2020 12:53 PM

## 2020-05-21 NOTE — Progress Notes (Signed)
[facility] MD Progress Note  05/21/2020 1:18 PM Christopher Morgan  MRN:  [id number]  Principal Problem: Psychosis [facility]) Diagnosis: Principal Problem:   Psychosis (HCC) Active Problems:   HIV positive (HCC)   History of syphilis  Total Time spent with patient: 1 hour  Christopher Morgan is a  24y.o. male who presents to the [facility] unit for treatment of psychosis.   Interval History Patient was seen today for re-evaluation.  Nursing reports no events overnight. The patient has no issues with performing ADLs.  Patient has been medication compliant.    Subjective:  Patient seems acutely worse this morning. He reports that he feels certain that someone is trying to kill him today, and has called 911. When asking about symptoms he often pauses to look away, and presses his ear as if responding to internal stimuli. When asked about hearing voices, he denies. He reports that staff and patients seem to be watching him, and trying to prevent him from getting help. He is very suspicious of the nurse giving him a note with his mother's phone number attached since he states he already knew the number. He notes that he also feels threatened by the phones being turned off after he called 911. Agree to place a call to his mother on speaker phone so he has a chance to express himself. Also walked through potential diagnosis of substance induced psychosis vs. Brief psychotic disorder vs. Bipolar I disorder with psychosis. Patient denies that he could have a potential mental illness, and feels that we are simply giving him these medications to cause him to go to sleep so we can kill him.  He reports "feeling good" and would like to go home so he is not killed here.  Current suicidal/homicidal ideations: Denies Current auditory/visual hallucinations: Denies The patient reports no side effects from medications.    Labs: no new results for review.   Past Psychiatric History: Mother reports that as a child he had some odd behavior and  some school refusal at times. Patient had no reported memory of it. No clear diagnosis. As an adult appears to have had no mental health issues reported  Past Medical History: History reviewed. No pertinent past medical history. History reviewed. No pertinent surgical history. Family History: History reviewed. No pertinent family history. Family Psychiatric  History: None Social History:  Social History   Substance and Sexual Activity  Alcohol Use No     Social History   Substance and Sexual Activity  Drug Use No    Social History   Socioeconomic History  . Marital status: Single    Spouse name: Not on file  . Number of children: Not on file  . Years of education: Not on file  . Highest education level: Not on file  Occupational History  . Not on file  Tobacco Use  . Smoking status: Current Every Day Smoker    Packs/day: 0.03    Types: Cigarettes  . Smokeless tobacco: Never Used  Substance and Sexual Activity  . Alcohol use: No  . Drug use: No  . Sexual activity: Not on file  Other Topics Concern  . Not on file  Social History Narrative  . Not on file   Social Determinants of Health   Financial Resource Strain:   . Difficulty of Paying Living Expenses: Not on file  Food Insecurity:   . Worried About Programme researcher, broadcasting/film/video in the Last Year: Not on file  . Ran Out of Food in the  Last Year: Not on file  Transportation Needs:   . Lack of Transportation (Medical): Not on file  . Lack of Transportation (Non-Medical): Not on file  Physical Activity:   . Days of Exercise per Week: Not on file  . Minutes of Exercise per Session: Not on file  Stress:   . Feeling of Stress : Not on file  Social Connections:   . Frequency of Communication with Friends and Family: Not on file  . Frequency of Social Gatherings with Friends and Family: Not on file  . Attends Religious Services: Not on file  . Active Member of Clubs or Organizations: Not on file  . Attends Tax inspector Meetings: Not on file  . Marital Status: Not on file   Additional Social History:                         Sleep: Fair  Appetite:  Fair  Current Medications: Current Facility-Administered Medications  Medication Dose Route Frequency Provider Last Rate Last Admin  . acetaminophen (TYLENOL) tablet 650 mg  650 mg Oral Q6H PRN Clapacs, John T, MD      . alum & mag hydroxide-simeth (MAALOX/MYLANTA) 200-200-20 MG/5ML suspension 30 mL  30 mL Oral Q4H PRN Clapacs, John T, MD      . ARIPiprazole (ABILIFY) tablet 15 mg  15 mg Oral Daily [name], MD   15 mg at 05/21/20 0844  . hydrOXYzine (ATARAX/VISTARIL) tablet 50 mg  50 mg Oral TID PRN Clapacs, [name], MD   50 mg at 05/20/20 2051  . magnesium hydroxide (MILK OF MAGNESIA) suspension 30 mL  30 mL Oral Daily PRN Clapacs, John T, MD      . OLANZapine (ZYPREXA) tablet 10 mg  10 mg Oral Q8H PRN [name], MD       Or  . OLANZapine (ZYPREXA) injection 10 mg  10 mg Intramuscular Q8H PRN [name], MD      . risperiDONE (RISPERDAL) tablet 2 mg  2 mg Oral QHS [name], MD      . traZODone (DESYREL) tablet 100 mg  100 mg Oral QHS PRN Clapacs, [name], MD   100 mg at 05/19/20 2127    Lab Results:  No results found for this or any previous visit (from the past 48 hour(s)).  Blood Alcohol level:  Lab Results  Component Value Date   ETH <10 05/16/2020    Metabolic Disorder Labs: Lab Results  Component Value Date   HGBA1C 5.0 05/18/2020   MPG 97 05/18/2020   No results found for: PROLACTIN Lab Results  Component Value Date   CHOL 131 05/18/2020   TRIG 105 05/18/2020   HDL 51 05/18/2020   CHOLHDL 2.6 05/18/2020   VLDL 21 05/18/2020   LDLCALC 59 05/18/2020    Physical Findings: AIMS: Facial and Oral Movements Muscles of Facial Expression: None, normal Lips and Perioral Area: None, normal Jaw: None, normal Tongue: None, normal,Extremity Movements Upper (arms, wrists, hands, fingers):  None, normal Lower (legs, knees, ankles, toes): None, normal, Trunk Movements Neck, shoulders, hips: None, normal, Overall Severity Severity of abnormal movements (highest score from questions above): None, normal Incapacitation due to abnormal movements: None, normal Patient's awareness of abnormal movements (rate only patient's report): No Awareness, Dental Status Current problems with teeth and/or dentures?: No Does patient usually wear dentures?: No  CIWA:    COWS:     Musculoskeletal: Strength & Muscle Tone:  within normal limits Gait & Station: normal Patient leans: N/A  Psychiatric Specialty Exam: Physical Exam Vitals and nursing note reviewed.  Constitutional:      General: He is in acute distress.  HENT:     Head: Normocephalic and atraumatic.     Right Ear: External ear normal.     Left Ear: External ear normal.     Nose: Nose normal.     Mouth/Throat:     Mouth: Mucous membranes are moist.     Pharynx: Oropharynx is clear.  Eyes:     Extraocular Movements: Extraocular movements intact.     Conjunctiva/sclera: Conjunctivae normal.     Pupils: Pupils are equal, round, and reactive to light.  Cardiovascular:     Rate and Rhythm: Normal rate.     Pulses: Normal pulses.  Pulmonary:     Effort: Pulmonary effort is normal.     Breath sounds: Normal breath sounds.  Abdominal:     General: Abdomen is flat.     Palpations: Abdomen is soft.  Musculoskeletal:        General: No swelling. Normal range of motion.     Cervical back: Normal range of motion and neck supple.  Skin:    General: Skin is warm and dry.  Neurological:     General: No focal deficit present.     Mental Status: He is alert and oriented to person, place, and time.  Psychiatric:        Attention and Perception: He is inattentive.        Mood and Affect: Mood is anxious. Affect is labile.        Speech: Speech is rapid and pressured.        Behavior: Behavior is agitated.        Thought Content:  Thought content is paranoid and delusional.        Cognition and Memory: Cognition is impaired. Memory is impaired.        Judgment: Judgment is impulsive.     Review of Systems  Constitutional: Positive for fatigue. Negative for appetite change.  HENT: Negative for rhinorrhea and sore throat.   Eyes: Negative for photophobia and visual disturbance.  Respiratory: Negative for cough and shortness of breath.   Cardiovascular: Negative for chest pain and palpitations.  Gastrointestinal: Negative for constipation, diarrhea, nausea and vomiting.  Endocrine: Negative for cold intolerance and heat intolerance.  Genitourinary: Negative for difficulty urinating and dysuria.  Musculoskeletal: Negative for arthralgias and back pain.  Skin: Negative for rash and wound.  Allergic/Immunologic: Negative for environmental allergies and food allergies.  Neurological: Negative for dizziness and headaches.  Hematological: Negative for adenopathy. Does not bruise/bleed easily.  Psychiatric/Behavioral: Positive for agitation and behavioral problems. Negative for suicidal ideas. The patient is nervous/anxious and is hyperactive.     Blood pressure (!) 134/96, pulse (!) 58, temperature 97.9 F (36.6 C), temperature source Oral, resp. rate 19, height  (1.702 m), weight 58.1 kg, SpO2 100 %.Body mass index is 20.05 kg/m.  General Appearance: Bizarre  Eye Contact:  Good  Speech: wnl  Volume:  Normal  Mood:  anxious  Affect:  labile  Thought Process: disorganized  Orientation:  Full (Time, Place, and Person)  Thought Content:  Delusions that staff is trying to kill him  Suicidal Thoughts:  No  Homicidal Thoughts:  No  Memory:  Immediate;   Fair Recent;   Fair Remote;   NA  Judgement: limited  Insight:  Limited  Psychomotor Activity:  Normal  Concentration:  Concentration: Fair and Attention Span: Fair  Recall:  [employer] of Knowledge:  Fair  Language:  Fair  Akathisia:  No  Handed:  Right   AIMS (if indicated):     Assets:  Communication Skills Desire for Improvement  ADL's:  Intact  Cognition:  Impaired,  Mild  Sleep:  Number of Hours: 3     Treatment Plan Summary: Daily contact with patient to assess and evaluate symptoms and progress in treatment and Medication management Patient is a 24 year old male/male with the above-stated past psychiatric history who is seen in follow-up.  Chart reviewed. Patient discussed with nursing. Patient is agitated, and expressing delusional content on assessment today. Sleep remains poor at nighttime. Current antipsychotic seems to be ineffective. Will add Risperdal 2 mg QHS; will continue to observe patient`s behavior and thought content.   Plan: -continue inpatient psych admission; 15-minute checks; daily contact with patient to assess and evaluate symptoms and progress in treatment; psychoeducation.  -continue scheduled medications: . ARIPiprazole  15 mg Oral Daily  . risperiDONE  2 mg Oral QHS   -continue PRN medications.  acetaminophen, alum & mag hydroxide-simeth, hydrOXYzine, magnesium hydroxide, OLANZapine **OR** OLANZapine, traZODone   -Pertinent Labs: no new labs ordered today  -EKG: on 10/28 showed Qtc 385 with normal sinus rhythm    -Consults: patient followed by ID consult service; he needs to be set up for HIV care at [facility] prior to discharge.   -Disposition: patient is not ready for discharge yet. -  I certify that the patient does need, on a daily basis, active treatment furnished directly by or requiring the supervision of inpatient psychiatric facility personnel.   [name], MD 05/21/2020, 1:18 PM

## 2020-05-21 NOTE — Plan of Care (Signed)
Patient seems calm and appropriate with staff and peers.Patient states " I am not paranoid anymore." Denies SI,HI and AVH.Appetite and energy level good.Support and encouragement given.

## 2020-05-21 NOTE — Progress Notes (Signed)
This morning patient was starring at people acting bizarre.Patient refused medications and states " you are trying to kill me." Patient came out of medication room multiple times pointing out different staff states " the lady in this room and the other one all trying to kill me." Patient took medications after multiple encouragement.Patient states " I think I am hallucinating that makes me crazy." Patient did not specify anything about hallucination. Patient came to Nurses station states " I am going to fall.When staff hold patients hand he sat himself on the floor.Then walked back to his room with staff. Support and encouragement given.

## 2020-05-21 NOTE — Plan of Care (Signed)
  Problem: Health Behavior/Discharge Planning: Goal: Compliance with treatment plan for underlying cause of condition will improve Outcome: Progressing   Problem: Safety: Goal: Periods of time without injury will increase Outcome: Progressing   Problem: Coping: Goal: Coping ability will improve Outcome: Progressing Goal: Will verbalize feelings Outcome: Progressing

## 2020-05-22 MED ORDER — ARIPIPRAZOLE 10 MG PO TABS
20.0000 mg | ORAL_TABLET | Freq: Every day | ORAL | Status: DC
  Administered 2020-05-23 – 2020-05-24 (×2): 20 mg via ORAL
  Filled 2020-05-22 (×2): qty 2

## 2020-05-22 NOTE — [facility] Group Notes (Signed)
LCSW Group Therapy Note  05/22/2020 2:30 PM  Type of Therapy/Topic:  Group Therapy:  Feelings about Diagnosis  Participation Level:  Minimal   Description of Group:   This group will allow patients to explore their thoughts and feelings about diagnoses they have received. Patients will be guided to explore their level of understanding and acceptance of these diagnoses. Facilitator will encourage patients to process their thoughts and feelings about the reactions of others to their diagnosis and will guide patients in identifying ways to discuss their diagnosis with significant others in their lives. This group will be process-oriented, with patients participating in exploration of their own experiences, giving and receiving support, and processing challenge from other group members.   Therapeutic Goals: 1. Patient will demonstrate understanding of diagnosis as evidenced by identifying two or more symptoms of the disorder 2. Patient will be able to express two feelings regarding the diagnosis 3. Patient will demonstrate their ability to communicate their needs through discussion and/or role play  Summary of Patient Progress: Pt denies any mental health diagnosis and states that he cannot participate in the discussion. Pt was in and out of the group room many times, in an effort to check-in the with psychiatrist. Concerned about his discharge date.  Therapeutic Modalities:   Cognitive Behavioral Therapy Brief Therapy Feelings Identification   Earl R. Algis Greenhouse, MSW, LCSW, LCAS 05/22/2020 2:30 PM

## 2020-05-22 NOTE — Progress Notes (Signed)
Patient alert and oriented x 2 with periods of confusion to situation and time, he is suspicious of staff and paranoid at times, he appears anxious and restless he was offered emotional support and encouragement and was frequently redirected for safety. Patient is receptive to staff, he currently denies SI/HI/AVH but appears responding to internal stimuli. 15 minutes safety checks was maintained will continue to monitor closely.

## 2020-05-22 NOTE — Plan of Care (Signed)
Pt denies depression, anxiety, SI, HI and AVH. Pt was educated on care plan and verbalizes understanding. Izola Teague RN Problem: Education: Goal: Knowledge of [facility] Education information/materials will improve Outcome: Progressing   Problem: Health Behavior/Discharge Planning: Goal: Identification of resources available to assist in meeting health care needs will improve Outcome: Progressing Goal: Compliance with treatment plan for underlying cause of condition will improve Outcome: Progressing   Problem: Safety: Goal: Periods of time without injury will increase Outcome: Progressing   Problem: Coping: Goal: Coping ability will improve Outcome: Progressing Goal: Will verbalize feelings Outcome: Progressing   Problem: Self-Concept: Goal: Level of anxiety will decrease Outcome: Progressing   Problem: Education: Goal: Will be free of psychotic symptoms Outcome: Progressing Goal: Knowledge of the prescribed therapeutic regimen will improve Outcome: Progressing   Problem: Health Behavior/Discharge Planning: Goal: Compliance with prescribed medication regimen will improve Outcome: Progressing   

## 2020-05-22 NOTE — Progress Notes (Signed)
Recreation Therapy Notes  Date: 05/22/2020  Time: 9:30 am   Location: Craft room  Behavioral response: Appropriate  Intervention Topic: Goals   Discussion/Intervention:  Group content on today was focused on goals. Patients described what goals are and how they define goals. Individuals expressed how they go about setting goals and reaching them. The group identified how important goals are and if they make short term goals to reach long term goals. Patients described how many goals they work on at a time and what affects them not reaching their goal. Individuals described how much time they put into planning and obtaining their goals. The group participated in the intervention "My Goal Board" and made personal goal boards to help them achieve their goal.  Clinical Observations/Feedback: Patient came to group and defined goals as something to try reach. He stated that his goal is to work. Participant  explained that he makes his goals based off wants and needs. Patient presented his goal board to the group. Individual was social with peers and staff while participating in the intervention.  Grey Schlauch LRT/CTRS         Salvador Coupe 05/22/2020 1:26 PM

## 2020-05-22 NOTE — Progress Notes (Signed)
D- Patient alert and oriented. Affect/mood is cooperative and calm. Pt denies SI, HI, AVH, and pain. Pt attended groups and has been social.   A- Scheduled medications administered to patient, per MD orders. Support and encouragement provided.  Routine safety checks conducted every 15 minutes.  Patient informed to notify staff with problems or concerns.  R- No adverse drug reactions noted. Patient contracts for safety at this time. Patient compliant with medications and treatment plan. Patient receptive, calm, and cooperative. Patient interacts well with others on the unit.  Patient remains safe at this time.  Torrie Mayers RN

## 2020-05-22 NOTE — Progress Notes (Signed)
[facility] MD Progress Note  05/22/2020 1:37 PM Christopher Morgan  MRN:  [id number]  Principal Problem: Psychosis [facility]) Diagnosis: Principal Problem:   Psychosis (HCC) Active Problems:   HIV positive (HCC)   History of syphilis  Total Time spent with patient: 45 minutes  [name] is a  24y.o. male who presents to the [facility] unit for treatment of psychosis.   Interval History Patient was seen today for re-evaluation.  Last received PRN zyprexa at 11AM yesterday. Overnight he required redirection, but was medication compliant. The patient has no issues with performing ADLs.  Patient has been medication compliant today.   Subjective:  Patient seems improved from yesterday. He apologized for his behavior to all staff members today. He has been participating well in groups and interacting appropriately with peers. He shows me his poster he created in group this morning with his goals of being open and honest with his family, not working more than 40 hours a week, and getting back home. He notes he spent a lot of time speaking to his family yesterday, and finally disclosed his HIV status. He requests that we call his family together again today. Mother and sister extremely supportive, and hopeful to pick him up this Thursday if he continues to do well. They also note improvement today from yesterday. Will increase Abilify to 20 mg daily, and continue Risperdal  2 mg nightly for now. Plan to taper off Risperdal for monotherapy once stabilized. Also provided reassurance that I would provide a letter for work when he is discharged from the hospital, and assist with FMLA if needed.   Current suicidal/homicidal ideations: Denies Current auditory/visual hallucinations: Denies The patient reports no side effects from medications.    Labs: no new results for review.   Past Psychiatric History: Mother reports that as a child he had some odd behavior and some school refusal at times. Patient had no reported memory of  it. No clear diagnosis. As an adult appears to have had no mental health issues reported  Past Medical History: History reviewed. No pertinent past medical history. History reviewed. No pertinent surgical history. Family History: History reviewed. No pertinent family history. Family Psychiatric  History: None Social History:  Social History   Substance and Sexual Activity  Alcohol Use No     Social History   Substance and Sexual Activity  Drug Use No    Social History   Socioeconomic History  . Marital status: Single    Spouse name: Not on file  . Number of children: Not on file  . Years of education: Not on file  . Highest education level: Not on file  Occupational History  . Not on file  Tobacco Use  . Smoking status: Current Every Day Smoker    Packs/day: 0.03    Types: Cigarettes  . Smokeless tobacco: Never Used  Substance and Sexual Activity  . Alcohol use: No  . Drug use: No  . Sexual activity: Not on file  Other Topics Concern  . Not on file  Social History Narrative  . Not on file   Social Determinants of Health   Financial Resource Strain:   . Difficulty of Paying Living Expenses: Not on file  Food Insecurity:   . Worried About Programme researcher, broadcasting/film/video in the Last Year: Not on file  . Ran Out of Food in the Last Year: Not on file  Transportation Needs:   . Lack of Transportation (Medical): Not on file  . Lack of Transportation (  Non-Medical): Not on file  Physical Activity:   . Days of Exercise per Week: Not on file  . Minutes of Exercise per Session: Not on file  Stress:   . Feeling of Stress : Not on file  Social Connections:   . Frequency of Communication with Friends and Family: Not on file  . Frequency of Social Gatherings with Friends and Family: Not on file  . Attends Religious Services: Not on file  . Active Member of Clubs or Organizations: Not on file  . Attends Banker Meetings: Not on file  . Marital Status: Not on file    Additional Social History:                         Sleep: Fair  Appetite:  Fair  Current Medications: Current Facility-Administered Medications  Medication Dose Route Frequency Provider Last Rate Last Admin  . acetaminophen (TYLENOL) tablet 650 mg  650 mg Oral Q6H PRN Clapacs, John T, MD      . alum & mag hydroxide-simeth (MAALOX/MYLANTA) 200-200-20 MG/5ML suspension 30 mL  30 mL Oral Q4H PRN Clapacs, John T, MD      . ARIPiprazole (ABILIFY) tablet 15 mg  15 mg Oral Daily [name], MD   15 mg at 05/22/20 0809  . hydrOXYzine (ATARAX/VISTARIL) tablet 50 mg  50 mg Oral TID PRN Clapacs, [name], MD   50 mg at 05/20/20 2051  . magnesium hydroxide (MILK OF MAGNESIA) suspension 30 mL  30 mL Oral Daily PRN Clapacs, John T, MD      . OLANZapine (ZYPREXA) tablet 10 mg  10 mg Oral Q8H PRN [name], MD       Or  . OLANZapine (ZYPREXA) injection 10 mg  10 mg Intramuscular Q8H PRN [name], MD      . risperiDONE (RISPERDAL) tablet 2 mg  2 mg Oral QHS [name], MD   2 mg at 05/21/20 2132  . traZODone (DESYREL) tablet 100 mg  100 mg Oral QHS PRN Clapacs, [name], MD   100 mg at 05/21/20 2132    Lab Results:  No results found for this or any previous visit (from the past 48 hour(s)).  Blood Alcohol level:  Lab Results  Component Value Date   ETH <10 05/16/2020    Metabolic Disorder Labs: Lab Results  Component Value Date   HGBA1C 5.0 05/18/2020   MPG 97 05/18/2020   No results found for: PROLACTIN Lab Results  Component Value Date   CHOL 131 05/18/2020   TRIG 105 05/18/2020   HDL 51 05/18/2020   CHOLHDL 2.6 05/18/2020   VLDL 21 05/18/2020   LDLCALC 59 05/18/2020    Physical Findings: AIMS: Facial and Oral Movements Muscles of Facial Expression: None, normal Lips and Perioral Area: None, normal Jaw: None, normal Tongue: None, normal,Extremity Movements Upper (arms, wrists, hands, fingers): None, normal Lower (legs, knees, ankles, toes):  None, normal, Trunk Movements Neck, shoulders, hips: None, normal, Overall Severity Severity of abnormal movements (highest score from questions above): None, normal Incapacitation due to abnormal movements: None, normal Patient's awareness of abnormal movements (rate only patient's report): No Awareness, Dental Status Current problems with teeth and/or dentures?: No Does patient usually wear dentures?: No  CIWA:    COWS:     Musculoskeletal: Strength & Muscle Tone: within normal limits Gait & Station: normal Patient leans: N/A  Psychiatric Specialty Exam: Physical Exam Vitals and nursing note  reviewed.  Constitutional:      Appearance: Normal appearance.  HENT:     Head: Normocephalic and atraumatic.     Right Ear: External ear normal.     Left Ear: External ear normal.     Nose: Nose normal.     Mouth/Throat:     Mouth: Mucous membranes are moist.     Pharynx: Oropharynx is clear.  Eyes:     Extraocular Movements: Extraocular movements intact.     Conjunctiva/sclera: Conjunctivae normal.     Pupils: Pupils are equal, round, and reactive to light.  Cardiovascular:     Rate and Rhythm: Normal rate.     Pulses: Normal pulses.  Pulmonary:     Effort: Pulmonary effort is normal.     Breath sounds: Normal breath sounds.  Abdominal:     General: Abdomen is flat.     Palpations: Abdomen is soft.  Musculoskeletal:        General: No swelling. Normal range of motion.     Cervical back: Normal range of motion and neck supple.  Skin:    General: Skin is warm and dry.  Neurological:     General: No focal deficit present.     Mental Status: He is alert and oriented to person, place, and time.  Psychiatric:        Attention and Perception: He is attentive.        Mood and Affect: Mood is anxious. Affect is labile.        Speech: Speech is rapid and pressured.        Behavior: Behavior is not agitated. Behavior is cooperative.        Thought Content: Thought content is  paranoid. Thought content is not delusional.        Cognition and Memory: Cognition is impaired. Memory is impaired.        Judgment: Judgment is impulsive.     Review of Systems  Constitutional: Negative for appetite change and fatigue.  HENT: Negative for rhinorrhea and sore throat.   Eyes: Negative for photophobia and visual disturbance.  Respiratory: Negative for cough and shortness of breath.   Cardiovascular: Negative for chest pain and palpitations.  Gastrointestinal: Negative for constipation, diarrhea, nausea and vomiting.  Endocrine: Negative for cold intolerance and heat intolerance.  Genitourinary: Negative for difficulty urinating and dysuria.  Musculoskeletal: Negative for arthralgias and back pain.  Skin: Negative for rash and wound.  Allergic/Immunologic: Negative for environmental allergies and food allergies.  Neurological: Negative for dizziness and headaches.  Hematological: Negative for adenopathy. Does not bruise/bleed easily.  Psychiatric/Behavioral: Negative for suicidal ideas. The patient is nervous/anxious and is hyperactive.     Blood pressure 112/71, pulse 82, temperature 97.7 F (36.5 C), temperature source Oral, resp. rate 18, height 5\' 7"  (1.702 m), weight 58.1 kg, SpO2 100 %.Body mass index is 20.05 kg/m.  General Appearance: Casual  Eye Contact:  Good  Speech: wnl  Volume:  Normal  Mood:  anxious  Affect:  labile  Thought Process: disorganized  Orientation:  Full (Time, Place, and Person)  Thought Content:  Denies delusions of staff trying to hurt him today  Suicidal Thoughts:  No  Homicidal Thoughts:  No  Memory:  Immediate;   Fair Recent;   Fair Remote;   NA  Judgement: limited  Insight:  Limited  Psychomotor Activity:  Normal  Concentration:  Concentration: Fair and Attention Span: Fair  Recall:  Fis[employer]Fair  Fund of Knowledge:  Fair  Language:  Fair  Akathisia:  No  Handed:  Right  AIMS (if indicated):     Assets:  Communication  Skills Desire for Improvement  ADL's:  Intact  Cognition:  Impaired,  Mild  Sleep:  Number of Hours: 5.15     Treatment Plan Summary: Daily contact with patient to assess and evaluate symptoms and progress in treatment and Medication management Patient is a 24 year old male/male with the above-stated past psychiatric history who is seen in follow-up.  Chart reviewed. Patient discussed with nursing. Patient is agitated, and expressing delusional content on assessment today. Sleep somewhat improved. Current antipsychotic seems to be ineffective. Continue Risperdal 2 mg QHS, increase Abilify 20 mg tomorrow ; will continue to observe patient`s behavior and thought content.   Plan: -continue inpatient psych admission; 15-minute checks; daily contact with patient to assess and evaluate symptoms and progress in treatment; psychoeducation.  -continue scheduled medications: . ARIPiprazole  15 mg Oral Daily  . risperiDONE  2 mg Oral QHS   -continue PRN medications.  acetaminophen, alum & mag hydroxide-simeth, hydrOXYzine, magnesium hydroxide, OLANZapine **OR** OLANZapine, traZODone   -Pertinent Labs: no new labs ordered today  -EKG: on 10/28 showed Qtc 385 with normal sinus rhythm    -Consults: patient followed by ID consult service; he needs to be set up for HIV care at [facility] center prior to discharge.   -Disposition: patient is not ready for discharge yet. -  I certify that the patient does need, on a daily basis, active treatment furnished directly by or requiring the supervision of inpatient psychiatric facility personnel.   [name], MD 05/22/2020, 1:37 PM

## 2020-05-23 ENCOUNTER — Other Ambulatory Visit: Payer: Self-pay | Admitting: Behavioral Health

## 2020-05-23 DIAGNOSIS — F23 Brief psychotic disorder: Principal | ICD-10-CM

## 2020-05-23 DIAGNOSIS — A530 Latent syphilis, unspecified as early or late: Secondary | ICD-10-CM

## 2020-05-23 LAB — HLA B*5701: HLA B 5701: NEGATIVE

## 2020-05-23 MED ORDER — RISPERIDONE 2 MG PO TABS: 2.0000 mg | ORAL_TABLET | Freq: Every day | ORAL | 0 refills | Status: DC

## 2020-05-23 MED ORDER — PENICILLIN G BENZATHINE 1200000 UNIT/2ML IM SUSP
2.4000 10*6.[IU] | Freq: Once | INTRAMUSCULAR | Status: AC
  Administered 2020-05-23: 2.4 10*6.[IU] via INTRAMUSCULAR
  Filled 2020-05-23: qty 4

## 2020-05-23 MED ORDER — ZIPRASIDONE MESYLATE 20 MG IM SOLR: 10.0000 mg | Freq: Four times a day (QID) | INTRAMUSCULAR | Status: DC | PRN

## 2020-05-23 MED ORDER — TRAZODONE HCL 100 MG PO TABS: 100.0000 mg | ORAL_TABLET | Freq: Every evening | ORAL | 0 refills | Status: DC | PRN

## 2020-05-23 MED ORDER — RISPERIDONE 1 MG PO TABS: 3.0000 mg | ORAL_TABLET | Freq: Every day | ORAL | Status: DC

## 2020-05-23 MED ORDER — ARIPIPRAZOLE 20 MG PO TABS: 20.0000 mg | ORAL_TABLET | Freq: Every day | ORAL | 0 refills | Status: DC

## 2020-05-23 MED ORDER — BICTEGRAVIR-EMTRICITAB-TENOFOV 50-200-25 MG PO TABS
1.0000 | ORAL_TABLET | Freq: Every day | ORAL | Status: DC
  Administered 2020-05-23 – 2020-05-29 (×7): 1 via ORAL
  Filled 2020-05-23 (×7): qty 1

## 2020-05-23 MED ORDER — RISPERIDONE 1 MG PO TABS
4.0000 mg | ORAL_TABLET | Freq: Every day | ORAL | Status: DC
  Administered 2020-05-23: 4 mg via ORAL
  Filled 2020-05-23: qty 4

## 2020-05-23 NOTE — Progress Notes (Signed)
Patient alert and oriented x 3, no distress noted, he is not suspicious or paranoid of   Staff,  he appears less anxious and he is interacting appropriately with peers and staff. Patient was offered emotional support and encouragement, he is receptive to staff, he currently denies SI/HI/AVH, 15 minutes safety checks was maintained will continue to monitor closely

## 2020-05-23 NOTE — Progress Notes (Signed)
Recreation Therapy Notes   Date: 05/23/2020  Time: 9:30 am   Location: Craft room  Behavioral response: Appropriate  Intervention Topic: Problem Solving    Discussion/Intervention:  Group content on today was focused on problem solving. The group described what problem solving is. Patients expressed how problems affect them and how they deal with problems. Individuals identified healthy ways to deal with problems. Patients explained what normally happens to them when they do not deal with problems. The group expressed reoccurring problems for them. The group participated in the intervention "Put the story together" with their peers and worked together to put a story that was broken up in the correct order.  Clinical Observations/Feedback: Patient came to group and defined problem solving as finding out the solution. He expressed that problem solving allows him to correct his own issues. Individual was social with peers and staff while participating in the intervention.  Sora Vrooman LRT/CTRS         Ebubechukwu Jedlicka 05/23/2020 3:19 PM

## 2020-05-23 NOTE — Progress Notes (Signed)
MD notified of escalating aggression. PRN will be given. Torrie Mayers RN

## 2020-05-23 NOTE — Progress Notes (Signed)
At around 1310, pt became aggressive, threatening, holding hands in fists, making martial arts movements in the air, pacing and running around the unit, slamming doors, ran into a male pts room and jumped on the bed and began kicking the window, yelling, "They are going to kill me!" This behavior was triggered by pt's paranoid thoughts, pt was asking staff if they wanted to kill him, then telling staff that they "you are going to kill me!" Pt was offered medications to help him calm down, but refused, verbal de-escalation attempts were not successful and pt continued to escalate. Security and MHT, and charge RN were present, pt tore off a cover to the plastic fire alarm, and was in danger harming self and others, pt was then placed in a physical hold for less than 5 minutes, given an injection of Zyprexa IM for agitation, and MD was made aware, orders present for hold. Cherre Huger, Associate Professor made aware via Consulting civil engineer.

## 2020-05-23 NOTE — Progress Notes (Signed)
Christopher Morgan became acutely agitated this afternoon. He was very paranoid through out the day that staff was trying to kill him by giving him medicines. Initially, he was verbally redirectable in the morning, and was able to calm with deep breathing techniques. However, at approximately 1:20PM he began to scream that staff was trying to kill him. He ran to try and open doors, and then ran into a patient room to try and kick down the windows. When staff and police attempted to verbally deescalate he moved to try and strike the police officers. Ultimately, a physical hold was required to administer Zyprexa 10 mg IM for acute psychosis and agitation. Afterwards, he was able to be redirected to his room.

## 2020-05-23 NOTE — Tx Team (Signed)
Interdisciplinary Treatment and Diagnostic Plan Update  05/23/2020 Time of Session: 9:00AM Areon Cocuzza MRN: [id number]  Principal Diagnosis: Psychosis [facility])  Secondary Diagnoses: Principal Problem:   Psychosis (HCC) Active Problems:   HIV positive (HCC)   History of syphilis   Current Medications:  Current Facility-Administered Medications  Medication Dose Route Frequency Provider Last Rate Last Admin  . acetaminophen (TYLENOL) tablet 650 mg  650 mg Oral Q6H PRN Clapacs, John T, MD      . alum & mag hydroxide-simeth (MAALOX/MYLANTA) 200-200-20 MG/5ML suspension 30 mL  30 mL Oral Q4H PRN Clapacs, John T, MD      . ARIPiprazole (ABILIFY) tablet 20 mg  20 mg Oral Daily [name], MD   20 mg at 05/23/20 0800  . hydrOXYzine (ATARAX/VISTARIL) tablet 50 mg  50 mg Oral TID PRN Clapacs, [name], MD   50 mg at 05/20/20 2051  . magnesium hydroxide (MILK OF MAGNESIA) suspension 30 mL  30 mL Oral Daily PRN Clapacs, John T, MD      . OLANZapine (ZYPREXA) tablet 10 mg  10 mg Oral Q8H PRN [name], MD       Or  . OLANZapine (ZYPREXA) injection 10 mg  10 mg Intramuscular Q8H PRN [name], MD      . risperiDONE (RISPERDAL) tablet 2 mg  2 mg Oral QHS [name], MD   2 mg at 05/22/20 2116  . traZODone (DESYREL) tablet 100 mg  100 mg Oral QHS PRN Clapacs, [name], MD   100 mg at 05/21/20 2132   PTA Medications: Medications Prior to Admission  Medication Sig Dispense Refill Last Dose  . azithromycin (ZITHROMAX Z-PAK) 250 MG tablet Take 2 tablets (500 mg) on  Day 1,  followed by 1 tablet (250 mg) once daily on Days 2 through 5. (Patient not taking: Reported on 05/16/2020) 6 each 0   . brompheniramine-pseudoephedrine-DM 30-2-10 MG/5ML syrup Take 10 mLs by mouth 4 (four) times daily as needed. (Patient not taking: Reported on 05/16/2020) 200 mL 0   . cephALEXin (KEFLEX) 500 MG capsule Take 1 capsule (500 mg total) by mouth 3 (three) times daily. (Patient not taking: Reported on  05/16/2020) 21 capsule 0   . HYDROcodone-acetaminophen (NORCO) 5-325 MG tablet Take 1 tablet by mouth every 6 (six) hours as needed for moderate pain. (Patient not taking: Reported on 05/16/2020) 15 tablet 0   . ibuprofen (ADVIL,MOTRIN) 600 MG tablet Take 1 tablet (600 mg total) by mouth every 8 (eight) hours as needed. (Patient not taking: Reported on 05/16/2020) 15 tablet 0   . traMADol (ULTRAM) 50 MG tablet Take 1 tablet (50 mg total) by mouth every 6 (six) hours as needed for moderate pain. (Patient not taking: Reported on 05/16/2020) 12 tablet 0     Patient Stressors: Financial difficulties Substance abuse  Patient Strengths: Wellsite geologist fund of knowledge Supportive family/friends Work skills  Treatment Modalities: Medication Management, Group therapy, Case management,  1 to 1 session with clinician, Psychoeducation, Recreational therapy.   Physician Treatment Plan for Primary Diagnosis: Psychosis (HCC) Long Term Goal(s): Improvement in symptoms so as ready for discharge Improvement in symptoms so as ready for discharge   Short Term Goals: Ability to identify changes in lifestyle to reduce recurrence of condition will improve Ability to verbalize feelings will improve Ability to demonstrate self-control will improve Ability to identify and develop effective coping behaviors will improve Compliance with prescribed medications will improve Ability to identify triggers associated  with substance abuse/mental health issues will improve Ability to identify changes in lifestyle to reduce recurrence of condition will improve Compliance with prescribed medications will improve  Medication Management: Evaluate patient's response, side effects, and tolerance of medication regimen.  Therapeutic Interventions: 1 to 1 sessions, Unit Group sessions and Medication administration.  Evaluation of Outcomes: Progressing  Physician Treatment Plan for Secondary Diagnosis:  Principal Problem:   Psychosis (HCC) Active Problems:   HIV positive (HCC)   History of syphilis  Long Term Goal(s): Improvement in symptoms so as ready for discharge Improvement in symptoms so as ready for discharge   Short Term Goals: Ability to identify changes in lifestyle to reduce recurrence of condition will improve Ability to verbalize feelings will improve Ability to demonstrate self-control will improve Ability to identify and develop effective coping behaviors will improve Compliance with prescribed medications will improve Ability to identify triggers associated with substance abuse/mental health issues will improve Ability to identify changes in lifestyle to reduce recurrence of condition will improve Compliance with prescribed medications will improve     Medication Management: Evaluate patient's response, side effects, and tolerance of medication regimen.  Therapeutic Interventions: 1 to 1 sessions, Unit Group sessions and Medication administration.  Evaluation of Outcomes: Progressing   RN Treatment Plan for Primary Diagnosis: Psychosis (HCC) Long Term Goal(s): Knowledge of disease and therapeutic regimen to maintain health will improve  Short Term Goals: Ability to verbalize frustration and anger appropriately will improve, Ability to demonstrate self-control, Ability to participate in decision making will improve, Ability to verbalize feelings will improve, Ability to identify and develop effective coping behaviors will improve and Compliance with prescribed medications will improve  Medication Management: RN will administer medications as ordered by provider, will assess and evaluate patient's response and provide education to patient for prescribed medication. RN will report any adverse and/or side effects to prescribing provider.  Therapeutic Interventions: 1 on 1 counseling sessions, Psychoeducation, Medication administration, Evaluate responses to treatment,  Monitor vital signs and CBGs as ordered, Perform/monitor CIWA, COWS, AIMS and Fall Risk screenings as ordered, Perform wound care treatments as ordered.  Evaluation of Outcomes: Progressing   LCSW Treatment Plan for Primary Diagnosis: Psychosis (HCC) Long Term Goal(s): Safe transition to appropriate next level of care at discharge, Engage patient in therapeutic group addressing interpersonal concerns.  Short Term Goals: Engage patient in aftercare planning with referrals and resources, Increase social support, Increase ability to appropriately verbalize feelings, Increase emotional regulation, Facilitate acceptance of mental health diagnosis and concerns, Identify triggers associated with mental health/substance abuse issues and Increase skills for wellness and recovery  Therapeutic Interventions: Assess for all discharge needs, 1 to 1 time with Social worker, Explore available resources and support systems, Assess for adequacy in community support network, Educate family and significant other(s) on suicide prevention, Complete Psychosocial Assessment, Interpersonal group therapy.  Evaluation of Outcomes: Progressing   Progress in Treatment: Attending groups: Yes. Participating in groups: No. Taking medication as prescribed: Yes. Toleration medication: Yes. Family/Significant other contact made: Yes, individual(s) contacted:  SPE completed with patient and mother. Patient understands diagnosis: Yes. Discussing patient identified problems/goals with staff: Yes. Medical problems stabilized or resolved: Yes. Denies suicidal/homicidal ideation: Yes. Issues/concerns per patient self-inventory: No. Other: None.  New problem(s) identified: No, Describe:  none.  New Short Term/Long Term Goal(s): elimination of symptoms of mania, medication management for mood stabilization; development of comprehensive mental wellness plan.  Update 05/23/2020:  No updates at this time.   Patient Goals: "I miss  my mother and my sister. I would like to go home."  Update 05/23/2020:  No updates at this time.   Discharge Plan or Barriers: Patient to discharge home with follow up through [facility]. Update 05/23/2020:  No updates at this time.   Reason for Continuation of Hospitalization: Mania Medical Issues Medication stabilization  Estimated Length of Stay: 1-7 days  Attendees: Patient:  05/23/2020 9:43 AM  Physician: Les Pou, MD 05/23/2020 9:43 AM  Nursing:  05/23/2020 9:43 AM  RN Care Manager: 05/23/2020 9:43 AM  Social Worker: Penni Homans, MSW, LCSW 05/23/2020 9:43 AM  Recreational Therapist:  05/23/2020 9:43 AM  Other: Vilma Meckel. Algis Greenhouse, MSW, [place], LCAS 05/23/2020 9:43 AM  Other:  05/23/2020 9:43 AM  Other: 05/23/2020 9:43 AM    Scribe for Treatment Team: Harden Mo, LCSW 05/23/2020 9:43 AM

## 2020-05-23 NOTE — Progress Notes (Signed)
[facility] MD Progress Note  05/23/2020 11:45 AM Christopher Morgan  MRN:  [id number]  Principal Problem: Psychosis [facility]) Diagnosis: Principal Problem:   Psychosis (HCC) Active Problems:   HIV positive (HCC)   History of syphilis  Total Time spent with patient: 45 minutes  Mr. Tamburo is a  24y.o. male who presents to the [facility] unit for treatment of psychosis.   Interval History Patient was seen today for re-evaluation.  Last received PRN zyprexa at 11AM yesterday. Overnight he required redirection, but was medication compliant. The patient has no issues with performing ADLs.  Patient has been medication compliant today.   Subjective:  Patient had a good day yesterday, but has worsened today. He approaches multiple staff members stating that the patients are watching him, and following him to his room in order to kill him. He is extremely difficult to redirect today, and feels that I am trying to kill him with Penicillin injection. He does eventually agree to enter office so that I can show him computer with his lab work. Time spent going over each lab. Also went over fact that he will need penicillin for syphilis, and Biktarvy for HIV. He eventually accepts diagnosis, and agrees to take medications. He continues to express paranoia, and states that I am the only person he can trust in here. He continues to seek me out when medications are offered by nursing staff to ensure they are not poison. Will increase Risperdal to 4 mg QHS today, will plan to discontinue Abilify when stabilized.    Contacted patient's mother, Ike Bene, at [phone] to provide update on patient's decline today, and inform her that discharge was not likely to occur tomorrow. She voices understanding.   Current suicidal/homicidal ideations: Denies Current auditory/visual hallucinations: Denies The patient reports no side effects from medications.    Labs: no new results for review.   Past Psychiatric History: Mother reports that as  a child he had some odd behavior and some school refusal at times. Patient had no reported memory of it. No clear diagnosis. As an adult appears to have had no mental health issues reported  Past Medical History: History reviewed. No pertinent past medical history. History reviewed. No pertinent surgical history. Family History: History reviewed. No pertinent family history. Family Psychiatric  History: None Social History:  Social History   Substance and Sexual Activity  Alcohol Use No     Social History   Substance and Sexual Activity  Drug Use No    Social History   Socioeconomic History  . Marital status: Single    Spouse name: Not on file  . Number of children: Not on file  . Years of education: Not on file  . Highest education level: Not on file  Occupational History  . Not on file  Tobacco Use  . Smoking status: Current Every Day Smoker    Packs/day: 0.03    Types: Cigarettes  . Smokeless tobacco: Never Used  Substance and Sexual Activity  . Alcohol use: No  . Drug use: No  . Sexual activity: Not on file  Other Topics Concern  . Not on file  Social History Narrative  . Not on file   Social Determinants of Health   Financial Resource Strain:   . Difficulty of Paying Living Expenses: Not on file  Food Insecurity:   . Worried About Programme researcher, broadcasting/film/video in the Last Year: Not on file  . Ran Out of Food in the Last Year: Not on file  Transportation Needs:   . Freight forwarderLack of Transportation (Medical): Not on file  . Lack of Transportation (Non-Medical): Not on file  Physical Activity:   . Days of Exercise per Week: Not on file  . Minutes of Exercise per Session: Not on file  Stress:   . Feeling of Stress : Not on file  Social Connections:   . Frequency of Communication with Friends and Family: Not on file  . Frequency of Social Gatherings with Friends and Family: Not on file  . Attends Religious Services: Not on file  . Active Member of Clubs or Organizations: Not  on file  . Attends BankerClub or Organization Meetings: Not on file  . Marital Status: Not on file   Additional Social History:     Sleep: Fair  Appetite:  Fair  Current Medications: Current Facility-Administered Medications  Medication Dose Route Frequency Provider Last Rate Last Admin  . acetaminophen (TYLENOL) tablet 650 mg  650 mg Oral Q6H PRN Clapacs, John T, MD      . alum & mag hydroxide-simeth (MAALOX/MYLANTA) 200-200-20 MG/5ML suspension 30 mL  30 mL Oral Q4H PRN Clapacs, John T, MD      . ARIPiprazole (ABILIFY) tablet 20 mg  20 mg Oral Daily Jesse SansFreeman, Mathew Storck M, MD   20 mg at 05/23/20 0800  . hydrOXYzine (ATARAX/VISTARIL) tablet 50 mg  50 mg Oral TID PRN Clapacs, Jackquline DenmarkJohn T, MD   50 mg at 05/20/20 2051  . magnesium hydroxide (MILK OF MAGNESIA) suspension 30 mL  30 mL Oral Daily PRN Clapacs, John T, MD      . OLANZapine (ZYPREXA) tablet 10 mg  10 mg Oral Q8H PRN Jesse SansFreeman, Athel Merriweather M, MD       Or  . OLANZapine (ZYPREXA) injection 10 mg  10 mg Intramuscular Q8H PRN Jesse SansFreeman, Carinne Brandenburger M, MD      . penicillin g benzathine (BICILLIN LA) 1200000 UNIT/2ML injection 2.4 Million Units  2.4 Million Units Intramuscular Once Lynn Itoavishankar, Jayashree, MD      . risperiDONE (RISPERDAL) tablet 3 mg  3 mg Oral QHS Jesse SansFreeman, Deneice Wack M, MD      . traZODone (DESYREL) tablet 100 mg  100 mg Oral QHS PRN Clapacs, Jackquline DenmarkJohn T, MD   100 mg at 05/21/20 2132    Lab Results:  No results found for this or any previous visit (from the past 48 hour(s)).  Blood Alcohol level:  Lab Results  Component Value Date   ETH <10 05/16/2020    Metabolic Disorder Labs: Lab Results  Component Value Date   HGBA1C 5.0 05/18/2020   MPG 97 05/18/2020   No results found for: PROLACTIN Lab Results  Component Value Date   CHOL 131 05/18/2020   TRIG 105 05/18/2020   HDL 51 05/18/2020   CHOLHDL 2.6 05/18/2020   VLDL 21 05/18/2020   LDLCALC 59 05/18/2020    Physical Findings: AIMS: Facial and Oral Movements Muscles of Facial  Expression: None, normal Lips and Perioral Area: None, normal Jaw: None, normal Tongue: None, normal,Extremity Movements Upper (arms, wrists, hands, fingers): None, normal Lower (legs, knees, ankles, toes): None, normal, Trunk Movements Neck, shoulders, hips: None, normal, Overall Severity Severity of abnormal movements (highest score from questions above): None, normal Incapacitation due to abnormal movements: None, normal Patient's awareness of abnormal movements (rate only patient's report): No Awareness, Dental Status Current problems with teeth and/or dentures?: No Does patient usually wear dentures?: No  CIWA:    COWS:     Musculoskeletal: Strength & Muscle Tone:  within normal limits Gait & Station: normal Patient leans: N/A  Psychiatric Specialty Exam: Physical Exam Vitals and nursing note reviewed.  Constitutional:      Appearance: Normal appearance.  HENT:     Head: Normocephalic and atraumatic.     Right Ear: External ear normal.     Left Ear: External ear normal.     Nose: Nose normal.     Mouth/Throat:     Mouth: Mucous membranes are moist.     Pharynx: Oropharynx is clear.  Eyes:     Extraocular Movements: Extraocular movements intact.     Conjunctiva/sclera: Conjunctivae normal.     Pupils: Pupils are equal, round, and reactive to light.  Cardiovascular:     Rate and Rhythm: Normal rate.     Pulses: Normal pulses.  Pulmonary:     Effort: Pulmonary effort is normal.     Breath sounds: Normal breath sounds.  Abdominal:     General: Abdomen is flat.     Palpations: Abdomen is soft.  Musculoskeletal:        General: No swelling. Normal range of motion.     Cervical back: Normal range of motion and neck supple.  Skin:    General: Skin is warm and dry.  Neurological:     General: No focal deficit present.     Mental Status: He is alert and oriented to person, place, and time.  Psychiatric:        Attention and Perception: He is attentive.        Mood  and Affect: Mood is anxious. Affect is labile.        Speech: Speech is rapid and pressured.        Behavior: Behavior is agitated. Behavior is cooperative.        Thought Content: Thought content is paranoid and delusional.        Cognition and Memory: Cognition is impaired. Memory is impaired.        Judgment: Judgment is impulsive.     Review of Systems  Constitutional: Negative for appetite change and fatigue.  HENT: Negative for rhinorrhea and sore throat.   Eyes: Negative for photophobia and visual disturbance.  Respiratory: Negative for cough and shortness of breath.   Cardiovascular: Negative for chest pain and palpitations.  Gastrointestinal: Negative for constipation, diarrhea, nausea and vomiting.  Endocrine: Negative for cold intolerance and heat intolerance.  Genitourinary: Negative for difficulty urinating and dysuria.  Musculoskeletal: Negative for arthralgias and back pain.  Skin: Negative for rash and wound.  Allergic/Immunologic: Negative for environmental allergies and food allergies.  Neurological: Negative for dizziness and headaches.  Hematological: Negative for adenopathy. Does not bruise/bleed easily.  Psychiatric/Behavioral: Negative for suicidal ideas. The patient is nervous/anxious and is hyperactive.     Blood pressure 126/87, pulse 79, temperature 97.7 F (36.5 C), temperature source Oral, resp. rate 18, height 5\' 7"  (1.702 m), weight 58.1 kg, SpO2 100 %.Body mass index is 20.05 kg/m.  General Appearance: Casual  Eye Contact:  Good  Speech: wnl  Volume:  Normal  Mood:  anxious  Affect:  labile  Thought Process: disorganized  Orientation:  Full (Time, Place, and Person)  Thought Content:  Delusions of staff and patients trying to kill him today  Suicidal Thoughts:  No  Homicidal Thoughts:  No  Memory:  Immediate;   Fair Recent;   Fair Remote;   NA  Judgement: limited  Insight:  Limited  Psychomotor Activity:  Normal  Concentration:   Concentration:  Fair and Attention Span: Fair  Recall:  [employer] of Knowledge:  Fair  Language:  Fair  Akathisia:  No  Handed:  Right  AIMS (if indicated):     Assets:  Communication Skills Desire for Improvement  ADL's:  Intact  Cognition:  Impaired,  Mild  Sleep:  Number of Hours: 8     Treatment Plan Summary: Daily contact with patient to assess and evaluate symptoms and progress in treatment and Medication management Patient is a 24 year old male/male with the above-stated past psychiatric history who is seen in follow-up.  Chart reviewed. Patient discussed with nursing. Patient is agitated, and expressing delusional content on assessment today. Sleep somewhat improved. Current antipsychotic seems to be ineffective. Increase  Risperdal 4 mg QHS, continue Abilify 20 mg tomorrow ; will continue to observe patient`s behavior and thought content.   Plan: -continue inpatient psych admission; 15-minute checks; daily contact with patient to assess and evaluate symptoms and progress in treatment; psychoeducation.  -continue scheduled medications: . ARIPiprazole  20 mg Oral Daily  . penicillin g benzathine (BICILLIN-LA) IM  2.4 Million Units Intramuscular Once  . risperiDONE  3 mg Oral QHS   -continue PRN medications.  acetaminophen, alum & mag hydroxide-simeth, hydrOXYzine, magnesium hydroxide, OLANZapine **OR** OLANZapine, traZODone   -Pertinent Labs: no new labs ordered today  -EKG: on 10/28 showed Qtc 385 with normal sinus rhythm    -Consults: patient followed by ID consult service. Will given IM Penicillin today, and will require 2 more shots. Currently declining lumbar function. Lab results sent to [facility] to coordinate aftercare. Will Contact Laurette Schimke, case manager, at [phone] to set up an appointment prior to discharge.      -Disposition: patient is not ready for discharge yet. -  I certify that the patient does need, on a daily basis, active  treatment furnished directly by or requiring the supervision of inpatient psychiatric facility personnel.   [name], MD 05/23/2020, 11:45 AM

## 2020-05-23 NOTE — Progress Notes (Signed)
D- Patient alert and oriented. Affect/mood is calm and blunt effect. Pt denies SI, HI, AVH, and pain.   A- Scheduled medications administered to patient, per MD orders. Support and encouragement provided.  Routine safety checks conducted every 15 minutes.  Patient informed to notify staff with problems or concerns.  R- No adverse drug reactions noted. Patient contracts for safety at this time. Patient compliant with medications and treatment plan. Patient receptive, calm, and cooperative. Patient interacts well with others on the unit.  Patient remains safe at this time.  Torrie Mayers RN

## 2020-05-23 NOTE — Progress Notes (Signed)
   Date of Admission:  05/17/2020   T   ID: Christopher Morgan is a 24 y.o. male  Principal Problem:   Psychosis (HCC) Active Problems:   HIV positive (HCC)   History of syphilis    Subjective: Pt says he got his PCN injection today Started on Biktarvy Wants to go home  Medications:  . ARIPiprazole  20 mg Oral Daily  . bictegravir-emtricitabine-tenofovir AF  1 tablet Oral Daily  . penicillin g benzathine (BICILLIN-LA) IM  2.4 Million Units Intramuscular Once  . risperiDONE  3 mg Oral QHS    Objective: Vital signs in last 24 hours: Temp:  [97.7 F (36.5 C)] 97.7 F (36.5 C) (11/03 0625) Pulse Rate:  [79] 79 (11/03 0625) BP: (126)/(87) 126/87 (11/03 0625) SpO2:  [100 %] 100 % (11/03 0625)  PHYSICAL EXAM:  General: Alert, cooperative, no distress, appears stated age.  Head: Normocephalic, without obvious abnormality, atraumatic. Eyes: Conjunctivae clear, anicteric sclerae. Pupils are equal ENT Nares normal. No drainage or sinus tenderness. Lips, mucosa, and tongue normal. No Thrush Neck: Supple, symmetrical, no adenopathy, thyroid: non tender no carotid bruit and no JVD. Lungs: Clear to auscultation bilaterally. No Wheezing or Rhonchi. No rales. Heart: Regular rate and rhythm, no murmur, rub or gallop. Abdomen: Soft, non-tender,not distended. Bowel sounds normal. No masses Extremities: atraumatic, no cyanosis. No edema. No clubbing Skin: No rashes or lesions. Or bruising Lymph: Cervical, supraclavicular normal. Neurologic: Grossly non-focal  Lab Results HIV RNA-1450 Cd4 is 635(33.4%) HIV genotype pending Hepatitis panel NR RPR 1:4   Assessment/Plan:  HIV disease..  Patient was tested in 2019 in the emergency department at [facility] and was positive.  The disease intervention specialist Tiara Remus Loffler was notified by Dr. Sampson Goon as patient was not reachable. Spoke to [employer] at [facility]- They had made 2 appts for him and he was a NO  show  Good cd4 and low VL Will start Biktarvy today Follow up with [facility]- call Laurette Schimke Case manager to make apt- [phone] Given 2.4 million unit of Benzathine penicillin today- next dose 05/30/20- He will have to go to health dept or [facility]  He is not anemic His platelet count is normal His LFTs are normal   RPR 1 is to 4.  Was 1:16 in  2019 received 1 dose in 2019.He refused LP today. Given 2.4 million unit of Benzathine penicillin today- next dose 05/30/20- 3rd dose 06/06/20  Clinically no evidence of neurosyphilis He will have to go to health dept or [facility] for his next doses   Patient has acute psychosis and is currently in the behavioral unit   Discussed the management with patient and his physician

## 2020-05-23 NOTE — Plan of Care (Signed)
Pt denies depression, anxiety, SI, HI and AVH. Pt was educated on care plan and verbalizes understanding. Megan Snider RN Problem: Education: Goal: Knowledge of [facility] Education information/materials will improve Outcome: Progressing   Problem: Health Behavior/Discharge Planning: Goal: Identification of resources available to assist in meeting health care needs will improve Outcome: Progressing Goal: Compliance with treatment plan for underlying cause of condition will improve Outcome: Progressing   Problem: Safety: Goal: Periods of time without injury will increase Outcome: Progressing   Problem: Coping: Goal: Coping ability will improve Outcome: Progressing Goal: Will verbalize feelings Outcome: Progressing   Problem: Self-Concept: Goal: Level of anxiety will decrease Outcome: Progressing   Problem: Education: Goal: Will be free of psychotic symptoms Outcome: Progressing Goal: Knowledge of the prescribed therapeutic regimen will improve Outcome: Progressing   Problem: Health Behavior/Discharge Planning: Goal: Compliance with prescribed medication regimen will improve Outcome: Progressing   

## 2020-05-24 MED ORDER — RISPERIDONE 1 MG PO TABS
1.0000 mg | ORAL_TABLET | Freq: Every day | ORAL | Status: DC
  Administered 2020-05-24 – 2020-05-29 (×6): 1 mg via ORAL
  Filled 2020-05-24 (×6): qty 1

## 2020-05-24 MED ORDER — ZIPRASIDONE MESYLATE 20 MG IM SOLR: 20.0000 mg | Freq: Four times a day (QID) | INTRAMUSCULAR | Status: DC | PRN

## 2020-05-24 MED ORDER — ARIPIPRAZOLE 5 MG PO TABS
15.0000 mg | ORAL_TABLET | Freq: Every day | ORAL | Status: DC
  Administered 2020-05-25: 15 mg via ORAL
  Filled 2020-05-24: qty 1

## 2020-05-24 MED ORDER — RISPERIDONE 1 MG PO TABS
3.0000 mg | ORAL_TABLET | Freq: Every day | ORAL | Status: DC
  Administered 2020-05-24 – 2020-05-28 (×5): 3 mg via ORAL
  Filled 2020-05-24 (×5): qty 3

## 2020-05-24 NOTE — Progress Notes (Signed)
Recreation Therapy Notes  Date: 05/24/2020  Time: 9:30 am   Location: Craft room  Behavioral response: Appropriate  Intervention Topic: Leisure  Discussion/Intervention:  Group content today was focused on leisure. The group defined what leisure is and some positive leisure activities they participate in. Individuals identified the difference between good and bad leisure. Participants expressed how they feel after participating in the leisure of their choice. The group discussed how they go about picking a leisure activity and if others are involved in their leisure activities. The patient stated how many leisure activities they have to choose from and reasons why it is important to have leisure time. Individuals participated in the intervention "Exploration of Leisure" where they had a chance to identify new leisure activities as well as benefits of leisure.  Clinical Observations/Feedback: Patient came to group and explained that sometimes working stops him from participating in leisure activities. Individual was social with peers and staff while participating in the intervention.  Kc Summerson LRT/CTRS         Chelcy Bolda 05/24/2020 1:40 PM

## 2020-05-24 NOTE — Plan of Care (Signed)
  Problem: Health Behavior/Discharge Planning: Goal: Compliance with treatment plan for underlying cause of condition will improve Outcome: Progressing   Problem: Safety: Goal: Periods of time without injury will increase Outcome: Progressing   Problem: Coping: Goal: Will verbalize feelings Outcome: Progressing   Problem: Self-Concept: Goal: Level of anxiety will decrease Outcome: Progressing

## 2020-05-24 NOTE — Plan of Care (Signed)
Patient came to nurses station and apologized for his actions yesterday.Patient verbalized some paranoia about staff today.No aggressive behaviors noted.Denies SI,HI and AVH.Attended groups.Compliant with medications.Appetite and energy level good.Support and encouragement given.

## 2020-05-24 NOTE — BHH Group Notes (Signed)
LCSW Group Therapy Note  05/24/2020 2:26 PM  Type of Therapy/Topic:  Group Therapy:  Balance in Life  Participation Level:  Active  Description of Group:    This group will address the concept of balance and how it feels and looks when one is unbalanced. Patients will be encouraged to process areas in their lives that are out of balance and identify reasons for remaining unbalanced. Facilitators will guide patients in utilizing problem-solving interventions to address and correct the stressor making their life unbalanced. Understanding and applying boundaries will be explored and addressed for obtaining and maintaining a balanced life. Patients will be encouraged to explore ways to assertively make their unbalanced needs known to significant others in their lives, using other group members and facilitator for support and feedback.  Therapeutic Goals: 1. Patient will identify two or more emotions or situations they have that consume much of in their lives. 2. Patient will identify signs/triggers that life has become out of balance:  3. Patient will identify two ways to set boundaries in order to achieve balance in their lives:  4. Patient will demonstrate ability to communicate their needs through discussion and/or role plays  Summary of Patient Progress: Pt was present in the group and participated. He still presents with some thoughts that people are playing games with him, however, he was able to keep it together during the group process. Pt denies any mental health issues.   Therapeutic Modalities:   Cognitive Behavioral Therapy Solution-Focused Therapy Assertiveness Training  [employer]. Algis Greenhouse, MSW, LCSW, LCAS 05/24/2020 2:26 PM

## 2020-05-24 NOTE — Progress Notes (Signed)
Patient pleasant and cooperative. Denies any SI, HI, AVH. Medication compliant. Appropriate with staff and peers. Slept well without sleep aide. Pt reports he is not crazy he was upset this afternoon. Pt spent some time in milieu interacting with peers. Encouragement and support provided. Safety checks maintained. Medications given as prescribed. Pt receptive and remains safe on unit with q 15 min checks.

## 2020-05-24 NOTE — Progress Notes (Signed)
[facility] MD Progress Note  05/24/2020 2:13 PM Christopher Morgan  MRN:  [id number]  Principal Problem: Psychosis [facility]) Diagnosis: Principal Problem:   Psychosis (HCC) Active Problems:   HIV positive (HCC)   History of syphilis  Total Time spent with patient: 45 minutes  Christopher Morgan is a  24y.o. male who presents to the [facility] unit for treatment of psychosis.   Interval History Patient was seen today for re-evaluation.  Last received PRN zyprexa at 1347 yesterday. Overnight he required redirection, but was medication compliant. The patient has no issues with performing ADLs.  Patient has been medication compliant today.   Subjective: Yesterday patient exhibited paranoia and felt that staff was trying to kill him. He attempted to elope, and was given PRN Zyprexa. Today patient approaches provider and notes that he was acting out a lot of behaviors yesterday. Specifically he noted that he was aware he could not break the windows, and was aware that slumping to the ground would create attention. However, he continues to exhibit delusional and paranoid content. He continues to state there are multiple players in the game trying to control him and prevent his discharge. He feels the patients are in on a game as well. At this time, even if he is exaggerating some behaviors, it appears he is still acutely psychotic and delusional. He has not showed any improvement with oral Abilify. Will begin to taper this medication, and continue with increased dose of Risperdal 1 mg in the morning, 3 mg at bedtime. Will consider adding Depakote for mood lability if he does not show improvement soon. He remains medication compliant with both psychiatric and HIV medications. Today he denies suicidal ideation, homicidal ideation, visual hallucinations, and auditory hallucinations. Mood remains very labile. He is mostly elevated with pressured speech, but intermittently becomes tearful.    Current suicidal/homicidal ideations:  Denies Current auditory/visual hallucinations: Denies The patient reports no side effects from medications.    Labs: no new results for review.   Past Psychiatric History: Mother reports that as a child he had some odd behavior and some school refusal at times. Patient had no reported memory of it. No clear diagnosis. As an adult appears to have had no mental health issues reported  Past Medical History: History reviewed. No pertinent past medical history. History reviewed. No pertinent surgical history. Family History: History reviewed. No pertinent family history. Family Psychiatric  History: None Social History:  Social History   Substance and Sexual Activity  Alcohol Use No     Social History   Substance and Sexual Activity  Drug Use No    Social History   Socioeconomic History  . Marital status: Single    Spouse name: Not on file  . Number of children: Not on file  . Years of education: Not on file  . Highest education level: Not on file  Occupational History  . Not on file  Tobacco Use  . Smoking status: Current Every Day Smoker    Packs/day: 0.03    Types: Cigarettes  . Smokeless tobacco: Never Used  Substance and Sexual Activity  . Alcohol use: No  . Drug use: No  . Sexual activity: Not on file  Other Topics Concern  . Not on file  Social History Narrative  . Not on file   Social Determinants of Health   Financial Resource Strain:   . Difficulty of Paying Living Expenses: Not on file  Food Insecurity:   . Worried About Programme researcher, broadcasting/film/video in  the Last Year: Not on file  . Ran Out of Food in the Last Year: Not on file  Transportation Needs:   . Lack of Transportation (Medical): Not on file  . Lack of Transportation (Non-Medical): Not on file  Physical Activity:   . Days of Exercise per Week: Not on file  . Minutes of Exercise per Session: Not on file  Stress:   . Feeling of Stress : Not on file  Social Connections:   . Frequency of Communication  with Friends and Family: Not on file  . Frequency of Social Gatherings with Friends and Family: Not on file  . Attends Religious Services: Not on file  . Active Member of Clubs or Organizations: Not on file  . Attends BankerClub or Organization Meetings: Not on file  . Marital Status: Not on file   Additional Social History:     Sleep: Fair  Appetite:  Fair  Current Medications: Current Facility-Administered Medications  Medication Dose Route Frequency Provider Last Rate Last Admin  . acetaminophen (TYLENOL) tablet 650 mg  650 mg Oral Q6H PRN Clapacs, John T, MD      . alum & mag hydroxide-simeth (MAALOX/MYLANTA) 200-200-20 MG/5ML suspension 30 mL  30 mL Oral Q4H PRN Clapacs, John T, MD      . Melene Muller[START ON 05/25/2020] ARIPiprazole (ABILIFY) tablet 15 mg  15 mg Oral Daily Jesse SansFreeman, Camey Edell M, MD      . bictegravir-emtricitabine-tenofovir AF (BIKTARVY) 50-200-25 MG per tablet 1 tablet  1 tablet Oral Daily Lynn Itoavishankar, Jayashree, MD   1 tablet at 05/24/20 0745  . hydrOXYzine (ATARAX/VISTARIL) tablet 50 mg  50 mg Oral TID PRN Clapacs, Jackquline DenmarkJohn T, MD   50 mg at 05/20/20 2051  . magnesium hydroxide (MILK OF MAGNESIA) suspension 30 mL  30 mL Oral Daily PRN Clapacs, John T, MD      . risperiDONE (RISPERDAL) tablet 1 mg  1 mg Oral Daily Jesse SansFreeman, Nikolette Reindl M, MD   1 mg at 05/24/20 1256  . risperiDONE (RISPERDAL) tablet 3 mg  3 mg Oral QHS Jesse SansFreeman, Emma Birchler M, MD      . traZODone (DESYREL) tablet 100 mg  100 mg Oral QHS PRN Clapacs, Jackquline DenmarkJohn T, MD   100 mg at 05/21/20 2132  . ziprasidone (GEODON) injection 20 mg  20 mg Intramuscular Q6H PRN Jesse SansFreeman, Amier Hoyt M, MD        Lab Results:  No results found for this or any previous visit (from the past 48 hour(s)).  Blood Alcohol level:  Lab Results  Component Value Date   ETH <10 05/16/2020    Metabolic Disorder Labs: Lab Results  Component Value Date   HGBA1C 5.0 05/18/2020   MPG 97 05/18/2020   No results found for: PROLACTIN Lab Results  Component Value Date    CHOL 131 05/18/2020   TRIG 105 05/18/2020   HDL 51 05/18/2020   CHOLHDL 2.6 05/18/2020   VLDL 21 05/18/2020   LDLCALC 59 05/18/2020    Physical Findings: AIMS: Facial and Oral Movements Muscles of Facial Expression: None, normal Lips and Perioral Area: None, normal Jaw: None, normal Tongue: None, normal,Extremity Movements Upper (arms, wrists, hands, fingers): None, normal Lower (legs, knees, ankles, toes): None, normal, Trunk Movements Neck, shoulders, hips: None, normal, Overall Severity Severity of abnormal movements (highest score from questions above): None, normal Incapacitation due to abnormal movements: None, normal Patient's awareness of abnormal movements (rate only patient's report): No Awareness, Dental Status Current problems with teeth and/or dentures?: No Does patient  usually wear dentures?: No  CIWA:    COWS:     Musculoskeletal: Strength & Muscle Tone: within normal limits Gait & Station: normal Patient leans: N/A  Psychiatric Specialty Exam: Physical Exam Vitals and nursing note reviewed.  Constitutional:      Appearance: Normal appearance.  HENT:     Head: Normocephalic and atraumatic.     Right Ear: External ear normal.     Left Ear: External ear normal.     Nose: Nose normal.     Mouth/Throat:     Mouth: Mucous membranes are moist.     Pharynx: Oropharynx is clear.  Eyes:     Extraocular Movements: Extraocular movements intact.     Conjunctiva/sclera: Conjunctivae normal.     Pupils: Pupils are equal, round, and reactive to light.  Cardiovascular:     Rate and Rhythm: Normal rate.     Pulses: Normal pulses.  Pulmonary:     Effort: Pulmonary effort is normal.     Breath sounds: Normal breath sounds.  Abdominal:     General: Abdomen is flat.     Palpations: Abdomen is soft.  Musculoskeletal:        General: No swelling. Normal range of motion.     Cervical back: Normal range of motion and neck supple.  Skin:    General: Skin is warm and  dry.  Neurological:     General: No focal deficit present.     Mental Status: He is alert and oriented to person, place, and time.  Psychiatric:        Attention and Perception: He is attentive.        Mood and Affect: Mood is anxious. Affect is labile.        Speech: Speech is rapid and pressured.        Behavior: Behavior is agitated.        Thought Content: Thought content is paranoid and delusional.        Cognition and Memory: Cognition is impaired. Memory is impaired.        Judgment: Judgment is impulsive.     Review of Systems  Constitutional: Negative for appetite change and fatigue.  HENT: Negative for rhinorrhea and sore throat.   Eyes: Negative for photophobia and visual disturbance.  Respiratory: Negative for cough and shortness of breath.   Cardiovascular: Negative for chest pain and palpitations.  Gastrointestinal: Negative for constipation, diarrhea, nausea and vomiting.  Endocrine: Negative for cold intolerance and heat intolerance.  Genitourinary: Negative for difficulty urinating and dysuria.  Musculoskeletal: Negative for arthralgias and back pain.  Skin: Negative for rash and wound.  Allergic/Immunologic: Negative for environmental allergies and food allergies.  Neurological: Negative for dizziness and headaches.  Hematological: Negative for adenopathy. Does not bruise/bleed easily.  Psychiatric/Behavioral: Negative for suicidal ideas. The patient is nervous/anxious and is hyperactive.     Blood pressure (!) 123/96, pulse (!) 107, temperature 98.7 F (37.1 C), temperature source Oral, resp. rate 18, height 5\' 7"  (1.702 m), weight 58.1 kg, SpO2 100 %.Body mass index is 20.05 kg/m.  General Appearance: Casual  Eye Contact:  Good  Speech: wnl  Volume:  Normal  Mood:  anxious  Affect:  labile  Thought Process: disorganized  Orientation:  Full (Time, Place, and Person)  Thought Content:  Delusions of staff and patients are playing an intricate game to keep him  in the hospital  Suicidal Thoughts:  No  Homicidal Thoughts:  No  Memory:  Immediate;   Fair  Recent;   Fair Remote;   NA  Judgement: limited  Insight:  Limited  Psychomotor Activity:  Normal  Concentration:  Concentration: Fair and Attention Span: Fair  Recall:  [employer] of Knowledge:  Fair  Language:  Fair  Akathisia:  No  Handed:  Right  AIMS (if indicated):     Assets:  Communication Skills Desire for Improvement  ADL's:  Intact  Cognition:  Impaired,  Mild  Sleep:  Number of Hours: 6.75     Treatment Plan Summary: Daily contact with patient to assess and evaluate symptoms and progress in treatment and Medication management Patient is a 24 year old male/male with the above-stated past psychiatric history who is seen in follow-up.  Chart reviewed. Patient discussed with nursing. Patient is agitated, and expressing delusional content on assessment today. Sleep somewhat improved. Current antipsychotic seems to be ineffective. Will give Risperdal 1 mg in the morning, 3 mg at night, decrease Abilify 15 mg tomorrow with plan to discontinue due to lack of efficacy; will continue to observe patient`s behavior and thought content.   Plan: -continue inpatient psych admission; 15-minute checks; daily contact with patient to assess and evaluate symptoms and progress in treatment; psychoeducation.  -continue scheduled medications: . [START ON 05/25/2020] ARIPiprazole  15 mg Oral Daily  . bictegravir-emtricitabine-tenofovir AF  1 tablet Oral Daily  . risperiDONE  1 mg Oral Daily  . risperiDONE  3 mg Oral QHS   -continue PRN medications.  acetaminophen, alum & mag hydroxide-simeth, hydrOXYzine, magnesium hydroxide, traZODone, ziprasidone   -Pertinent Labs: no new labs ordered today  -EKG: on 10/28 showed Qtc 385 with normal sinus rhythm    -Consults: patient followed by ID consult service. Will given IM Penicillin today, and will require 2 more shots. Currently declining lumbar  puncture. Lab results sent to [facility] to coordinate aftercare. Will Contact Laurette Schimke, case manager, at [phone] to set up an appointment prior to discharge.      -Disposition: patient is not ready for discharge yet. -  I certify that the patient does need, on a daily basis, active treatment furnished directly by or requiring the supervision of inpatient psychiatric facility personnel.   [name], MD 05/24/2020, 2:13 PM

## 2020-05-24 NOTE — [facility] Group Notes (Signed)
[facility] Group Notes:  (Nursing/MHT/Case Management/Adjunct)  Date:  05/24/2020  Time:  10:30 PM  Type of Therapy:  Group Therapy  Participation Level:  Active  Participation Quality:  Appropriate  Affect:  Appropriate  Cognitive:  Alert  Insight:  Good  Engagement in Group:  Engaged and goal is to control behavior.  Modes of Intervention:  Support  Summary of Progress/Problems:  Christopher Morgan 05/24/2020, 10:30 PM

## 2020-05-25 MED ORDER — ARIPIPRAZOLE 5 MG PO TABS
5.0000 mg | ORAL_TABLET | Freq: Every day | ORAL | Status: AC
  Administered 2020-05-26: 5 mg via ORAL
  Filled 2020-05-25: qty 1

## 2020-05-25 MED ORDER — DIVALPROEX SODIUM 500 MG PO DR TAB
500.0000 mg | DELAYED_RELEASE_TABLET | Freq: Two times a day (BID) | ORAL | Status: DC
  Administered 2020-05-25 – 2020-05-29 (×9): 500 mg via ORAL
  Filled 2020-05-25 (×9): qty 1

## 2020-05-25 MED ORDER — ARIPIPRAZOLE 5 MG PO TABS: 5.0000 mg | ORAL_TABLET | Freq: Every day | ORAL | Status: DC

## 2020-05-25 NOTE — [facility] Group Notes (Signed)
LCSW Group Therapy Note  05/25/2020 2:39 PM  Type of Therapy/Topic:  Group Therapy:  Emotion Regulation  Participation Level:  Active   Description of Group:   The purpose of this group is to assist patients in learning to regulate negative emotions and experience positive emotions. Patients will be guided to discuss ways in which they have been vulnerable to their negative emotions. These vulnerabilities will be juxtaposed with experiences of positive emotions or situations, and patients will be challenged to use positive emotions to combat negative ones. Special emphasis will be placed on coping with negative emotions in conflict situations, and patients will process healthy conflict resolution skills.  Therapeutic Goals: 1. Patient will identify two positive emotions or experiences to reflect on in order to balance out negative emotions 2. Patient will label two or more emotions that they find the most difficult to experience 3. Patient will demonstrate positive conflict resolution skills through discussion and/or role plays  Summary of Patient Progress: Patient was present in group.  Patient was able to identify the benefits of recognizing healthy and unhealthy coping skills.  He reports that he engages in stretching as his coping skills.  He was able to identify that healthy coping skills helps him to have a better success rate.   Therapeutic Modalities:   Cognitive Behavioral Therapy Feelings Identification Dialectical Behavioral Therapy  Penni Homans, MSW, LCSW 05/25/2020 2:39 PM

## 2020-05-25 NOTE — Plan of Care (Signed)
Pt denies depression, anxiety, SI, HI and AVH. Pt was educated on care plan and verbalizes understanding. Torrie Mayers RN Problem: Education: Goal: Charity fundraiser Education information/materials will improve Outcome: Progressing   Problem: Health Behavior/Discharge Planning: Goal: Identification of resources available to assist in meeting health care needs will improve Outcome: Progressing Goal: Compliance with treatment plan for underlying cause of condition will improve Outcome: Progressing   Problem: Safety: Goal: Periods of time without injury will increase Outcome: Progressing   Problem: Coping: Goal: Coping ability will improve Outcome: Progressing Goal: Will verbalize feelings Outcome: Progressing   Problem: Self-Concept: Goal: Level of anxiety will decrease Outcome: Progressing   Problem: Education: Goal: Will be free of psychotic symptoms Outcome: Progressing Goal: Knowledge of the prescribed therapeutic regimen will improve Outcome: Progressing   Problem: Health Behavior/Discharge Planning: Goal: Compliance with prescribed medication regimen will improve Outcome: Progressing

## 2020-05-25 NOTE — Progress Notes (Signed)
   Date of Admission:  05/17/2020   T   ID: Christopher Morgan is a 24 y.o. male  Principal Problem:   Psychosis (HCC) Active Problems:   HIV positive (HCC)   History of syphilis    Subjective: Pt doing well No specific complaint But as per MD he had suicidal ideation and hence not being discharged  Medications:  . [START ON 05/26/2020] ARIPiprazole  5 mg Oral Daily  . bictegravir-emtricitabine-tenofovir AF  1 tablet Oral Daily  . divalproex  500 mg Oral Q12H  . risperiDONE  1 mg Oral Daily  . risperiDONE  3 mg Oral QHS    Objective: Vital signs in last 24 hours: Temp:  [98.8 F (37.1 C)] 98.8 F (37.1 C) (11/05 6283) Pulse Rate:  [126] 126 (11/05 0614) Resp:  [18] 18 (11/05 6629) BP: (129)/(89) 129/89 (11/05 0614) SpO2:  [100 %] 100 % (11/05 4765)  PHYSICAL EXAM:  General: Alert, cooperative, no distress, appears stated age.  Head: Normocephalic, without obvious abnormality, atraumatic. Eyes: Conjunctivae clear, anicteric sclerae. Pupils are equal ENT Nares normal. No drainage or sinus tenderness. Lips, mucosa, and tongue normal. No Thrush Lymph: Cervical, supraclavicular normal. Neurologic: Grossly non-focal  Lab Results HIV RNA-1450 Cd4 is 635(33.4%) HIV genotype pending Hepatitis panel NR RPR 1:4   Assessment/Plan:  HIV disease..  Patient was tested in 2019 in the emergency department at [facility] and was positive.  The disease intervention specialist Christopher Morgan was notified then and she had reached him Spoke to Christopher Morgan at [facility]- They had made 2 appts for him and he was a NO show He is now willing to be engaged in care and has an appt with [facility] on 06/28/20 Because he has to receive 2 more weekly Penicillin , the next one on 05/30/20 he will come to my office to get it if he is discharged If still in the [facility] he will get it here Will need to get him Biktarvy for a month until HMAP is applied and approved- It looks like he does not  have Medicaid  Good cd4 and low VL   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  Was 1:16 in  2019 received 1 dose in 2019. He refused LP . Given 2.4 million unit of Benzathine penicillin on 05/23/20 - next dose 05/30/20- 3rd dose 06/06/20  Clinically no evidence of neurosyphilis He will have to go to health dept or [facility] for his next doses   Patient has acute psychosis and is currently in the behavioral unit   Discussed the management with patient and his physician Gave the patient my card with appt time

## 2020-05-25 NOTE — Progress Notes (Signed)
[facility] MD Progress Note  05/25/2020 11:34 AM Christopher Morgan  MRN:  [id number]  Principal Problem: Psychosis [facility]) Diagnosis: Principal Problem:   Psychosis (HCC) Active Problems:   HIV positive (HCC)   History of syphilis  Total Time spent with patient: 45 minutes  [name] is a  24y.o. male who presents to the [facility] unit for treatment of psychosis.   Interval History Patient was seen today for re-evaluation.  Last received PRN zyprexa at 1347 Weds. Overnight he required redirection, but was medication compliant. The patient has no issues with performing ADLs.  Patient has been medication compliant today.   Subjective: Today patient seen one-on-one in office. This morning he initially presented as elevated and grandiose speaking to staff and peers alike with great enthusiam. He informs me that he is all better and set up an appointment with [facility], and has called his mother to pick him up. 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 He states he is done playing our intricate mind game on the unit. His speech is pressured, and he is extremely difficult to interrupt. Upon telling him that I would not be discharging him today he immediately becomes tearful, and states that he is going to kill himself. He states he has been horribly depressed in the hospital, and will end his life if he cannot see his mother. He insists that we call her together.   Call placed to mother, Ike Bene, at [phone]. As call begins Sam immediately shouts to his mother that we are trying kill him, and keep his body here indefinitely. He also screams that he will kill himself. Mother is able to somewhat redirect him. 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 Bipolar I disorder with psychosis given mood component witnessed in the hospital. We  discussed how Abilify was not helpful, and that this was being discontinued. Also discussed adding Depakote today for mood stabilization while continuing risperdal for acute psychosis and paranoia. Mother is in agreement with differential and medication changes. Koston continues to insist he is going to kill himself at this time if we do not let him discharge. Explained that he cannot leave the hospital if feeling suicidal multiple times.   Patient has remained medication compliant despite difficult interview this morning. He took Depakote without incident at 10:35 this morning. He remains complaint with HIV medications as well.     Current suicidal/homicidal ideations: Denies Current auditory/visual hallucinations: Denies The patient reports no side effects from medications.    Labs: no new results for review.   Past Psychiatric History: Mother reports that as a child he had some odd behavior and some school refusal at times. Patient had no reported memory of it. No clear diagnosis. As an adult appears to have had no mental health issues reported  Past Medical History: History reviewed. No pertinent past medical history. History reviewed. No pertinent surgical history. Family History: History reviewed. No pertinent family history. Family Psychiatric  History: None Social History:  Social History   Substance and Sexual Activity  Alcohol Use No     Social History   Substance and Sexual Activity  Drug Use No    Social History   Socioeconomic History  . Marital status: Single    Spouse name: Not on file  . Number of children: Not on file  . Years of education: Not on  file  . Highest education level: Not on file  Occupational History  . Not on file  Tobacco Use  . Smoking status: Current Every Day Smoker    Packs/day: 0.03    Types: Cigarettes  . Smokeless tobacco: Never Used  Substance and Sexual Activity  . Alcohol use: No  . Drug use: No  . Sexual activity: Not on  file  Other Topics Concern  . Not on file  Social History Narrative  . Not on file   Social Determinants of Health   Financial Resource Strain:   . Difficulty of Paying Living Expenses: Not on file  Food Insecurity:   . Worried About Programme researcher, broadcasting/film/video in the Last Year: Not on file  . Ran Out of Food in the Last Year: Not on file  Transportation Needs:   . Lack of Transportation (Medical): Not on file  . Lack of Transportation (Non-Medical): Not on file  Physical Activity:   . Days of Exercise per Week: Not on file  . Minutes of Exercise per Session: Not on file  Stress:   . Feeling of Stress : Not on file  Social Connections:   . Frequency of Communication with Friends and Family: Not on file  . Frequency of Social Gatherings with Friends and Family: Not on file  . Attends Religious Services: Not on file  . Active Member of Clubs or Organizations: Not on file  . Attends Banker Meetings: Not on file  . Marital Status: Not on file   Additional Social History:     Sleep: Fair  Appetite:  Fair  Current Medications: Current Facility-Administered Medications  Medication Dose Route Frequency Provider Last Rate Last Admin  . acetaminophen (TYLENOL) tablet 650 mg  650 mg Oral Q6H PRN Clapacs, John T, MD      . alum & mag hydroxide-simeth (MAALOX/MYLANTA) 200-200-20 MG/5ML suspension 30 mL  30 mL Oral Q4H PRN Clapacs, [name], MD      . Melene Muller ON 05/26/2020] ARIPiprazole (ABILIFY) tablet 5 mg  5 mg Oral Daily [name], MD      . bictegravir-emtricitabine-tenofovir AF (BIKTARVY) 50-200-25 MG per tablet 1 tablet  1 tablet Oral Daily Lynn Ito, MD   1 tablet at 05/25/20 0754  . divalproex (DEPAKOTE) DR tablet 500 mg  500 mg Oral Q12H [name], MD   500 mg at 05/25/20 1035  . hydrOXYzine (ATARAX/VISTARIL) tablet 50 mg  50 mg Oral TID PRN Clapacs, [name], MD   50 mg at 05/24/20 2107  . magnesium hydroxide (MILK OF MAGNESIA) suspension 30 mL  30  mL Oral Daily PRN Clapacs, John T, MD      . risperiDONE (RISPERDAL) tablet 1 mg  1 mg Oral Daily [name], MD   1 mg at 05/25/20 0754  . risperiDONE (RISPERDAL) tablet 3 mg  3 mg Oral QHS [name], MD   3 mg at 05/24/20 2107  . traZODone (DESYREL) tablet 100 mg  100 mg Oral QHS PRN Clapacs, [name], MD   100 mg at 05/24/20 2107  . ziprasidone (GEODON) injection 20 mg  20 mg Intramuscular Q6H PRN [name], MD        Lab Results:  No results found for this or any previous visit (from the past 48 hour(s)).  Blood Alcohol level:  Lab Results  Component Value Date   ETH <10 05/16/2020    Metabolic Disorder Labs: Lab Results  Component Value  Date   HGBA1C 5.0 05/18/2020   MPG 97 05/18/2020   No results found for: PROLACTIN Lab Results  Component Value Date   CHOL 131 05/18/2020   TRIG 105 05/18/2020   HDL 51 05/18/2020   CHOLHDL 2.6 05/18/2020   VLDL 21 05/18/2020   LDLCALC 59 05/18/2020    Physical Findings: AIMS: Facial and Oral Movements Muscles of Facial Expression: None, normal Lips and Perioral Area: None, normal Jaw: None, normal Tongue: None, normal,Extremity Movements Upper (arms, wrists, hands, fingers): None, normal Lower (legs, knees, ankles, toes): None, normal, Trunk Movements Neck, shoulders, hips: None, normal, Overall Severity Severity of abnormal movements (highest score from questions above): None, normal Incapacitation due to abnormal movements: None, normal Patient's awareness of abnormal movements (rate only patient's report): No Awareness, Dental Status Current problems with teeth and/or dentures?: No Does patient usually wear dentures?: No  CIWA:    COWS:     Musculoskeletal: Strength & Muscle Tone: within normal limits Gait & Station: normal Patient leans: N/A  Psychiatric Specialty Exam: Physical Exam Vitals and nursing note reviewed.  Constitutional:      Appearance: Normal appearance.  HENT:     Head:  Normocephalic and atraumatic.     Right Ear: External ear normal.     Left Ear: External ear normal.     Nose: Nose normal.     Mouth/Throat:     Mouth: Mucous membranes are moist.     Pharynx: Oropharynx is clear.  Eyes:     Extraocular Movements: Extraocular movements intact.     Conjunctiva/sclera: Conjunctivae normal.     Pupils: Pupils are equal, round, and reactive to light.  Cardiovascular:     Rate and Rhythm: Normal rate.     Pulses: Normal pulses.  Pulmonary:     Effort: Pulmonary effort is normal.     Breath sounds: Normal breath sounds.  Abdominal:     General: Abdomen is flat.     Palpations: Abdomen is soft.  Musculoskeletal:        General: No swelling. Normal range of motion.     Cervical back: Normal range of motion and neck supple.  Skin:    General: Skin is warm and dry.  Neurological:     General: No focal deficit present.     Mental Status: He is alert and oriented to person, place, and time.  Psychiatric:        Attention and Perception: He is attentive.        Mood and Affect: Mood is anxious. Affect is labile.        Speech: Speech is rapid and pressured.        Behavior: Behavior is agitated.        Thought Content: Thought content is paranoid and delusional. Thought content includes suicidal ideation.        Cognition and Memory: Cognition is impaired. Memory is impaired.        Judgment: Judgment is impulsive.     Review of Systems  Constitutional: Negative for appetite change and fatigue.  HENT: Negative for rhinorrhea and sore throat.   Eyes: Negative for photophobia and visual disturbance.  Respiratory: Negative for cough and shortness of breath.   Cardiovascular: Negative for chest pain and palpitations.  Gastrointestinal: Negative for constipation, diarrhea, nausea and vomiting.  Endocrine: Negative for cold intolerance and heat intolerance.  Genitourinary: Negative for difficulty urinating and dysuria.  Musculoskeletal: Negative for  arthralgias and back pain.  Skin: Negative for rash  and wound.  Allergic/Immunologic: Negative for environmental allergies and food allergies.  Neurological: Negative for dizziness and headaches.  Hematological: Negative for adenopathy. Does not bruise/bleed easily.  Psychiatric/Behavioral: Positive for suicidal ideas. The patient is nervous/anxious and is hyperactive.     Blood pressure 129/89, pulse (!) 126, temperature 98.8 F (37.1 C), temperature source Oral, resp. rate 18, height 5\' 7"  (1.702 m), weight 58.1 kg, SpO2 100 %.Body mass index is 20.05 kg/m.  General Appearance: Casual  Eye Contact:  Good  Speech: pressured  Volume:  Normal  Mood:  anxious  Affect:  labile  Thought Process: disorganized  Orientation:  Full (Time, Place, and Person)  Thought Content:  Delusions of staff and patients are playing an intricate game to keep him in the hospital  Suicidal Thoughts:  Yes.  without intent/plan  Homicidal Thoughts:  No  Memory:  Immediate;   Fair Recent;   Fair Remote;   NA  Judgement: limited  Insight:  Limited  Psychomotor Activity:  Normal  Concentration:  Concentration: Fair and Attention Span: Fair  Recall:  of Knowledge:  Fair  Language:  Fair  Akathisia:  No  Handed:  Right  AIMS (if indicated):     Assets:  Communication Skills Desire for Improvement  ADL's:  Intact  Cognition:  Impaired,  Mild  Sleep:  Number of Hours: 6.75     Treatment Plan Summary: Daily contact with patient to assess and evaluate symptoms and progress in treatment and Medication management Patient is a 24 year old male/male with the above-stated past psychiatric history who is seen in follow-up.  Chart reviewed. Patient discussed with nursing. Patient is agitated, and expressing delusional content on assessment today. He continues to feel that the staff are playing an intricate mind game with him to stay in the hospital. He also expresses suicidal ideation today for the  first time. Sleep somewhat improved. Current antipsychotic seems to be ineffective. Will give Risperdal 1 mg in the morning, 3 mg at night, decrease Abilify to 5 mg tomorrow with plan to discontinue due to lack of efficacy. Start Depakote 500 mg BID for mood stabilization. will continue to observe patient`s behavior and thought content.   Plan: -continue inpatient psych admission; 15-minute checks; daily contact with patient to assess and evaluate symptoms and progress in treatment; psychoeducation.  -continue scheduled medications: . [START ON 05/26/2020] ARIPiprazole  5 mg Oral Daily  . bictegravir-emtricitabine-tenofovir AF  1 tablet Oral Daily  . divalproex  500 mg Oral Q12H  . risperiDONE  1 mg Oral Daily  . risperiDONE  3 mg Oral QHS   -continue PRN medications.  acetaminophen, alum & mag hydroxide-simeth, hydrOXYzine, magnesium hydroxide, traZODone, ziprasidone   -Pertinent Labs: no new labs ordered today  -EKG: on 10/28 showed Qtc 385 with normal sinus rhythm    -Consults: patient followed by ID consult service. Will given IM Penicillin today, and will require 2 more shots. Currently declining lumbar puncture. Lab results sent to [facility] to coordinate aftercare. Will Contact PHILHAVEN, case manager, at [phone] to set up an appointment prior to discharge.      -Disposition: patient is not ready for discharge yet. -  I certify that the patient does need, on a daily basis, active treatment furnished directly by or requiring the supervision of inpatient psychiatric facility personnel.   [phone], MD 05/25/2020, 11:34 AM

## 2020-05-25 NOTE — Progress Notes (Signed)
Recreation Therapy Notes   Date: 05/25/2020  Time: 9:30 am   Location: Craft room     Behavioral response: N/A   Intervention Topic: Emotions   Discussion/Intervention: Patient did not attend group.   Clinical Observations/Feedback:  Patient did not attend group.   Ziare Orrick LRT/CTRS           Jazminn Pomales 05/25/2020 11:48 AM

## 2020-05-25 NOTE — Progress Notes (Signed)
D- Patient alert and oriented. Affect/mood has been animated and excited this morning however pt became much more mellow and calm this afternoon. Pt has been cooperative today. Pt denies SI, HI, AVH, and pain. Pt has been withdrawn in his room the later part of the day.   A- Scheduled medications administered to patient, per MD orders. Support and encouragement provided.  Routine safety checks conducted every 15 minutes.  Patient informed to notify staff with problems or concerns.  R- No adverse drug reactions noted. Patient contracts for safety at this time. Patient compliant with medications and treatment plan. Patient receptive, calm, and cooperative. Patient interacts well with others on the unit.  Patient remains safe at this time.  Torrie Mayers RN

## 2020-05-25 NOTE — Progress Notes (Signed)
Patient is extremely paranoid. Says he does not trust this Thereasa Parkin because my badge has "case manager" on it, even though it also has "RN" on it. He stares into the nurses' station and says he does not trust various staff members and is convinced someone is going to kill him tonight, despite being reassured by various staff that he is safe and no one is going to kill him. Is very paranoid about his medications, like the fact that he was given Depakote at 2000 and Risperdal at bedtime makes him paranoid that he is getting too much medication. Has been calling 911 on the phone saying that someone is trying to kill him.

## 2020-05-25 NOTE — Plan of Care (Signed)
  Problem: Education: Goal: Knowledge of [facility] Education information/materials will improve Outcome: Not Progressing   Problem: Health Behavior/Discharge Planning: Goal: Identification of resources available to assist in meeting health care needs will improve Outcome: Not Progressing Goal: Compliance with treatment plan for underlying cause of condition will improve Outcome: Not Progressing   Problem: Safety: Goal: Periods of time without injury will increase Outcome: Not Progressing   Problem: Coping: Goal: Coping ability will improve Outcome: Not Progressing Goal: Will verbalize feelings Outcome: Not Progressing   Problem: Self-Concept: Goal: Level of anxiety will decrease Outcome: Not Progressing   Problem: Education: Goal: Will be free of psychotic symptoms Outcome: Not Progressing Goal: Knowledge of the prescribed therapeutic regimen will improve Outcome: Not Progressing   Problem: Health Behavior/Discharge Planning: Goal: Compliance with prescribed medication regimen will improve Outcome: Not Progressing

## 2020-05-25 NOTE — Progress Notes (Signed)
Cooperative with treatment. Denies SI,HI and AVH. No paranoia noted or voiced on shift.  Medication compliant. No issues to report on shift at this time.

## 2020-05-26 DIAGNOSIS — F259 Schizoaffective disorder, unspecified: Secondary | ICD-10-CM

## 2020-05-26 NOTE — Progress Notes (Signed)
[facility] MD Progress Note  05/26/2020 9:29 AM [name]  MRN:  [id number]  Principal Problem: Psychosis [facility]) Diagnosis: Principal Problem:   Psychosis (HCC) Active Problems:   HIV positive (HCC)   History of syphilis  Total Time spent with patient: 30 min   [name] is a  24y.o. male who presents to the [facility] unit for treatment of psychosis.   Interval History Patient was seen today for re-evaluation.  Pt called 911 saying somebody is trying to kill him  Subjective: People trying to kill me,  janitor is trying to kill me"  Today patient seen one-on-one .  Pt pacing on the unit , very paranoid, asking to let him leave because he believes people are trying to kill him, unable to tell why they would be after him. Denies hallucination. Pt anxious, taking meds, tolerating well.     Current suicidal/homicidal ideations: Denies Current auditory/visual hallucinations: Denies The patient reports no side effects from medications.    Labs: no new results for review.   Past Psychiatric History: per his mother,  as a child he had some odd behavior and some school refusal at times. Patient had no reported memory of it. No clear diagnosis. As an adult appears to have had no mental health issues reported  Past Medical History: History reviewed. No pertinent past medical history. History reviewed. No pertinent surgical history. Family History: History reviewed. No pertinent family history. Family Psychiatric  History: None Social History:  Social History   Substance and Sexual Activity  Alcohol Use No     Social History   Substance and Sexual Activity  Drug Use No    Social History   Socioeconomic History  . Marital status: Single    Spouse name: Not on file  . Number of children: Not on file  . Years of education: Not on file  . Highest education level: Not on file  Occupational History  . Not on file  Tobacco Use  . Smoking status: Current Every Day Smoker    Packs/day:  0.03    Types: Cigarettes  . Smokeless tobacco: Never Used  Substance and Sexual Activity  . Alcohol use: No  . Drug use: No  . Sexual activity: Not on file  Other Topics Concern  . Not on file  Social History Narrative  . Not on file   Social Determinants of Health   Financial Resource Strain:   . Difficulty of Paying Living Expenses: Not on file  Food Insecurity:   . Worried About Programme researcher, broadcasting/film/video in the Last Year: Not on file  . Ran Out of Food in the Last Year: Not on file  Transportation Needs:   . Lack of Transportation (Medical): Not on file  . Lack of Transportation (Non-Medical): Not on file  Physical Activity:   . Days of Exercise per Week: Not on file  . Minutes of Exercise per Session: Not on file  Stress:   . Feeling of Stress : Not on file  Social Connections:   . Frequency of Communication with Friends and Family: Not on file  . Frequency of Social Gatherings with Friends and Family: Not on file  . Attends Religious Services: Not on file  . Active Member of Clubs or Organizations: Not on file  . Attends Banker Meetings: Not on file  . Marital Status: Not on file   Additional Social History:     Sleep: Fair  Appetite:  Fair  Current Medications: Current Facility-Administered  Medications  Medication Dose Route Frequency Provider Last Rate Last Admin  . acetaminophen (TYLENOL) tablet 650 mg  650 mg Oral Q6H PRN Clapacs, John T, MD      . alum & mag hydroxide-simeth (MAALOX/MYLANTA) 200-200-20 MG/5ML suspension 30 mL  30 mL Oral Q4H PRN Clapacs, John T, MD      . bictegravir-emtricitabine-tenofovir AF (BIKTARVY) 50-200-25 MG per tablet 1 tablet  1 tablet Oral Daily Lynn Ito, MD   1 tablet at 05/26/20 0845  . divalproex (DEPAKOTE) DR tablet 500 mg  500 mg Oral Q12H [name], MD   500 mg at 05/26/20 0845  . hydrOXYzine (ATARAX/VISTARIL) tablet 50 mg  50 mg Oral TID PRN Clapacs, [name], MD   50 mg at 05/24/20 2107  .  magnesium hydroxide (MILK OF MAGNESIA) suspension 30 mL  30 mL Oral Daily PRN Clapacs, John T, MD      . risperiDONE (RISPERDAL) tablet 1 mg  1 mg Oral Daily [name], MD   1 mg at 05/26/20 0845  . risperiDONE (RISPERDAL) tablet 3 mg  3 mg Oral QHS [name], MD   3 mg at 05/25/20 2103  . traZODone (DESYREL) tablet 100 mg  100 mg Oral QHS PRN Clapacs, [name], MD   100 mg at 05/24/20 2107  . ziprasidone (GEODON) injection 20 mg  20 mg Intramuscular Q6H PRN [name], MD        Lab Results:  No results found for this or any previous visit (from the past 48 hour(s)).  Blood Alcohol level:  Lab Results  Component Value Date   ETH <10 05/16/2020    Metabolic Disorder Labs: Lab Results  Component Value Date   HGBA1C 5.0 05/18/2020   MPG 97 05/18/2020   No results found for: PROLACTIN Lab Results  Component Value Date   CHOL 131 05/18/2020   TRIG 105 05/18/2020   HDL 51 05/18/2020   CHOLHDL 2.6 05/18/2020   VLDL 21 05/18/2020   LDLCALC 59 05/18/2020    Physical Findings: AIMS: Facial and Oral Movements Muscles of Facial Expression: None, normal Lips and Perioral Area: None, normal Jaw: None, normal Tongue: None, normal,Extremity Movements Upper (arms, wrists, hands, fingers): None, normal Lower (legs, knees, ankles, toes): None, normal, Trunk Movements Neck, shoulders, hips: None, normal, Overall Severity Severity of abnormal movements (highest score from questions above): None, normal Incapacitation due to abnormal movements: None, normal Patient's awareness of abnormal movements (rate only patient's report): No Awareness, Dental Status Current problems with teeth and/or dentures?: No Does patient usually wear dentures?: No  CIWA:    COWS:     Musculoskeletal: Strength & Muscle Tone: within normal limits Gait & Station: normal Patient leans: N/A  Psychiatric Specialty Exam: Physical Exam Vitals and nursing note reviewed.  Constitutional:       Appearance: Normal appearance.  HENT:     Head: Normocephalic and atraumatic.     Right Ear: External ear normal.     Left Ear: External ear normal.     Mouth/Throat:     Mouth: Mucous membranes are moist.     Pharynx: Oropharynx is clear.  Eyes:     Extraocular Movements: Extraocular movements intact.     Conjunctiva/sclera: Conjunctivae normal.  Cardiovascular:     Rate and Rhythm: Normal rate.     Pulses: Normal pulses.  Pulmonary:     Effort: Pulmonary effort is normal.     Breath sounds: Normal breath sounds.  Musculoskeletal:  General: No swelling. Normal range of motion.     Cervical back: Normal range of motion and neck supple.  Neurological:     General: No focal deficit present.     Mental Status: He is alert and oriented to person, place, and time.  Psychiatric:        Attention and Perception: He is attentive.        Mood and Affect: Mood is anxious. Affect is labile.        Speech: Speech is rapid and pressured.        Behavior: Behavior is agitated.        Thought Content: Thought content is paranoid and delusional. Thought content includes suicidal ideation.        Cognition and Memory: Cognition is impaired. Memory is impaired.        Judgment: Judgment is impulsive.     Review of Systems  Constitutional: Negative for appetite change and fatigue.  HENT: Negative for rhinorrhea and sore throat.   Eyes: Negative for photophobia and visual disturbance.  Respiratory: Negative for cough and shortness of breath.   Cardiovascular: Negative for chest pain and palpitations.  Gastrointestinal: Negative for constipation, diarrhea, nausea and vomiting.  Endocrine: Negative for cold intolerance and heat intolerance.  Genitourinary: Negative for difficulty urinating and dysuria.  Musculoskeletal: Negative for arthralgias and back pain.  Skin: Negative for rash and wound.  Allergic/Immunologic: Negative for environmental allergies and food allergies.  Neurological:  Negative for dizziness and headaches.  Hematological: Negative for adenopathy. Does not bruise/bleed easily.  Psychiatric/Behavioral: Positive for suicidal ideas. The patient is nervous/anxious and is hyperactive.     Blood pressure 117/86, pulse (!) 120, temperature 98.3 F (36.8 C), resp. rate 18, height 5\' 7"  (1.702 m), weight 58.1 kg, SpO2 100 %.Body mass index is 20.05 kg/m.  General Appearance: Casual  Eye Contact:  Good  Speech: pressured  Volume:  Normal  Mood:  anxious  Affect:  labile  Thought Process: disorganized  Orientation:  Full (Time, Place, and Person)  Thought Content:  Delusions of staff and patients are playing an intricate game to keep him in the hospital, paranoid that people trying to kill him  Suicidal Thoughts:  Yes.  without intent/plan  Homicidal Thoughts:  No  Memory:  Immediate;   Fair Recent;   Fair Remote;   NA  Judgement: limited  Insight:  Limited  Psychomotor Activity:  Normal  Concentration:  Concentration: Fair and Attention Span: Fair  Recall:  [employer]Fair  Fund of Knowledge:  Fair  Language:  Fair  Akathisia:  No  Handed:  Right  AIMS (if indicated):     Assets:  Communication Skills Desire for Improvement  ADL's:  Intact  Cognition:  Impaired,  Mild  Sleep:  Number of Hours: 8.25     Treatment Plan Summary: Daily contact with patient to assess and evaluate symptoms and progress in treatment and Medication management Patient is a 24 year old male/male with the above-stated past psychiatric history who is seen in follow-up.  Chart reviewed. Patient discussed with nursing. Patient is pacing and paranoid  Delusional.  Sleep somewhat improved. Current antipsychotic seems to be ineffective. Will give Risperdal 1 mg in the morning, 3 mg at night, decrease Abilify to 5 mg tomorrow with plan to discontinue due to lack of efficacy.  Depakote 500 mg BID for mood stabilization. will continue to observe patient`s behavior and thought content.    Plan: -continue inpatient psych admission; 15-minute checks; daily contact with patient  to assess and evaluate symptoms and progress in treatment; psychoeducation.  -continue scheduled medications: . bictegravir-emtricitabine-tenofovir AF  1 tablet Oral Daily  . divalproex  500 mg Oral Q12H  . risperiDONE  1 mg Oral Daily  . risperiDONE  3 mg Oral QHS   -continue PRN medications.  acetaminophen, alum & mag hydroxide-simeth, hydrOXYzine, magnesium hydroxide, traZODone, ziprasidone   -Pertinent Labs: no new labs ordered today  -EKG: on 10/28 showed Qtc 385 with normal sinus rhythm    -Consults: patient followed by ID consult service. Will given IM Penicillin today, and will require 2 more shots. Currently declining lumbar puncture. Lab results sent to [facility] to coordinate aftercare. Will Contact Laurette Schimke, case manager, at [phone] to set up an appointment prior to discharge.      -Disposition: patient is not ready for discharge yet. -  I certify that the patient does need, on a daily basis, active treatment furnished directly by or requiring the supervision of inpatient psychiatric facility personnel.   Beverly Sessions, MD 05/26/2020, 9:29 AMPatient ID: Christopher Morgan, male   DOB: 22-Sep-1995, 24 y.o.   MRN: [id number]

## 2020-05-26 NOTE — [facility] Group Notes (Signed)
LCSW Aftercare Discharge Planning Group Note   05/26/2020 1:20 pm- 2:00 pm   Type of Group and Topic: Psychoeducational Group:  Discharge Planning  Participation Level:  Active  Description of Group  Discharge planning group reviews patient's anticipated discharge plans and assists patients to anticipate and address any barriers to wellness/recovery in the community.  Suicide prevention education is reviewed with patients in group.   Therapeutic Goals 1. Patients will state their anticipated discharge plan and mental health aftercare 2. Patients will identify potential barriers to wellness in the community setting 3. Patients will engage in problem solving, solution focused discussion of ways to anticipate and address barriers to wellness/recovery   Summary of Patient Progress: Patient came to group late. Patient spoke about his plans to get off drugs. Patient stated that he would also like to work on his behavior. Patient stated that he has improved since last weekend but feels like he will never go home. Patient stated it was tough to see the people he came into the unit with go home this weekend. Patient stated he has a good plan in place so he will not have to come back to the hospital.     Therapeutic Modalities: Motivational Interviewing    Susa Simmonds, LCSWA 05/26/2020 2:11 PM

## 2020-05-26 NOTE — Plan of Care (Signed)
D- Patient alert and oriented. Patient presents in a preoccupied, but pleasant mood this morning, reporting that he was able to sleep once "I barricaded my door, so couldn't nobody come in to kill me". Patient reports that someone is out to get him because of the length of time he's been in the hospital and that he should be gone. Patient denies depression, but stated that his anxiety is "through the roof". Patient stated that there were some "walky talky things in the bottom of my tray", in which he brought some plastic pieces up to the nurse's station reporting that these pieces were walky talkies. Patient denies SI, HI, AVH, and pain at this time. Patient's goal for today is "discharge plan, someone is trying to kill me"!!  A- Scheduled medications administered to patient, per MD orders. Support and encouragement provided.  Routine safety checks conducted every 15 minutes.  Patient informed to notify staff with problems or concerns.  R- No adverse drug reactions noted. Patient contracts for safety at this time. Patient compliant with medications. Patient receptive, calm, and cooperative. Patient interacts well with others on the unit.  Patient remains safe at this time.  Problem: Education: Goal: Knowledge of [facility] Education information/materials will improve Outcome: Not Progressing   Problem: Health Behavior/Discharge Planning: Goal: Identification of resources available to assist in meeting health care needs will improve Outcome: Not Progressing Goal: Compliance with treatment plan for underlying cause of condition will improve Outcome: Not Progressing   Problem: Safety: Goal: Periods of time without injury will increase Outcome: Not Progressing   Problem: Coping: Goal: Coping ability will improve Outcome: Not Progressing Goal: Will verbalize feelings Outcome: Not Progressing   Problem: Self-Concept: Goal: Level of anxiety will decrease Outcome: Not Progressing    Problem: Education: Goal: Will be free of psychotic symptoms Outcome: Not Progressing Goal: Knowledge of the prescribed therapeutic regimen will improve Outcome: Not Progressing   Problem: Health Behavior/Discharge Planning: Goal: Compliance with prescribed medication regimen will improve Outcome: Not Progressing

## 2020-05-26 NOTE — Progress Notes (Signed)
Patient is extremely paranoid, thinking that people are going to kill him. Patient believes that other members on the unit, along with some staff, are going to kill him.

## 2020-05-27 NOTE — [facility] Group Notes (Signed)
LCSW Group Therapy Note  05/27/2020 1:15PM-2:00PM   Type of Therapy and Topic:  Group Therapy:  Feelings About Hospitalization  Participation Level:  Active   Description of Group This process group involved patients discussing their feelings related to being hospitalized, as well as the benefits they see to being in the hospital.  These feelings and benefits were itemized.  The group then brainstormed specific ways in which they could seek those same benefits when they discharge and return home.  Therapeutic Goals 1. Patient will identify and describe positive and negative feelings related to hospitalization 2. Patient will verbalize benefits of hospitalization to themselves personally 3. Patients will brainstorm together ways they can obtain similar benefits in the outpatient setting, identify barriers to wellness and possible solutions    Summary of Patient Progress: Patient checked into group feeling good. Patient spoke about having a good experience in the hospital. Patient spoke about the staff being great and that his medication is helping him. Patient stated he is looking forward to discharge and believes he will get to go home this week. Patient spoke about having food daily and knows that he will be taken care of. Patient suggested karaoke during group and going outside more.    Therapeutic Modalities Cognitive Behavioral Therapy Motivational Interviewing   Susa Simmonds, [place] 05/27/2020 2:34 PM

## 2020-05-27 NOTE — Plan of Care (Signed)
D- Patient alert and oriented. Patient presents in a pleasant mood on assessment stating that he slept "really well" last night and had no complaints to voice to this Clinical research associate. Patient denies SI, HI, AVH, and pain at this time. Patient also denies depression and anxiety, reporting that overall, "I feel great". Patient's goal for today is "my behavior and discharge plan, I feel a lot better", in which he will "be on my best behavior", in order to achieve his goal. Patient wanted staff to know "thank-you for this amazing experience and thank-you for saving my life".  A- Scheduled medications administered to patient, per MD orders. Support and encouragement provided.  Routine safety checks conducted every 15 minutes.  Patient informed to notify staff with problems or concerns.  R- No adverse drug reactions noted. Patient contracts for safety at this time. Patient compliant with medications and treatment plan. Patient receptive, calm, and cooperative. Patient interacts well with others on the unit.  Patient remains safe at this time.  Problem: Education: Goal: Knowledge of [facility] Education information/materials will improve Outcome: Progressing   Problem: Health Behavior/Discharge Planning: Goal: Identification of resources available to assist in meeting health care needs will improve Outcome: Progressing Goal: Compliance with treatment plan for underlying cause of condition will improve Outcome: Progressing   Problem: Safety: Goal: Periods of time without injury will increase Outcome: Progressing   Problem: Coping: Goal: Coping ability will improve Outcome: Progressing Goal: Will verbalize feelings Outcome: Progressing   Problem: Self-Concept: Goal: Level of anxiety will decrease Outcome: Progressing   Problem: Education: Goal: Will be free of psychotic symptoms Outcome: Progressing Goal: Knowledge of the prescribed therapeutic regimen will improve Outcome: Progressing    Problem: Health Behavior/Discharge Planning: Goal: Compliance with prescribed medication regimen will improve Outcome: Progressing

## 2020-05-27 NOTE — Plan of Care (Signed)
  Problem: Education: Goal: Knowledge of [facility] Education information/materials will improve Outcome: Progressing   Problem: Health Behavior/Discharge Planning: Goal: Identification of resources available to assist in meeting health care needs will improve Outcome: Progressing Goal: Compliance with treatment plan for underlying cause of condition will improve Outcome: Progressing   Problem: Safety: Goal: Periods of time without injury will increase Outcome: Progressing   Problem: Coping: Goal: Coping ability will improve Outcome: Progressing Goal: Will verbalize feelings Outcome: Progressing   Problem: Self-Concept: Goal: Level of anxiety will decrease Outcome: Progressing   Problem: Education: Goal: Will be free of psychotic symptoms Outcome: Progressing Goal: Knowledge of the prescribed therapeutic regimen will improve Outcome: Progressing   Problem: Health Behavior/Discharge Planning: Goal: Compliance with prescribed medication regimen will improve Outcome: Progressing   

## 2020-05-27 NOTE — Plan of Care (Signed)
  Problem: Education: Goal: Knowledge of [facility] Education information/materials will improve Outcome: Progressing   Problem: Health Behavior/Discharge Planning: Goal: Identification of resources available to assist in meeting health care needs will improve Outcome: Progressing Goal: Compliance with treatment plan for underlying cause of condition will improve Outcome: Progressing   Problem: Safety: Goal: Periods of time without injury will increase Outcome: Progressing   Problem: Coping: Goal: Coping ability will improve Outcome: Progressing Goal: Will verbalize feelings Outcome: Progressing   Problem: Self-Concept: Goal: Level of anxiety will decrease Outcome: Progressing   Problem: Education: Goal: Will be free of psychotic symptoms Outcome: Progressing Goal: Knowledge of the prescribed therapeutic regimen will improve Outcome: Progressing   Problem: Health Behavior/Discharge Planning: Goal: Compliance with prescribed medication regimen will improve Outcome: Progressing

## 2020-05-27 NOTE — Progress Notes (Signed)
Patient has been calm, pleasant and cooperative. Playing cards in dayroom. Did his laundry. No paranoia expressed. Denies SI, HI and AVH. Medication compliant.

## 2020-05-27 NOTE — Progress Notes (Signed)
Patient's affect is bright. He apologized for his paranoid behavior yesterday and says that he is feeling much better. Denies SI, HI and AVH. No suspiciousness noted. Has been on the phone. [place] Spaniel he was talking with his twin sister, whom he says he is very close to. No incidences of him calling 911 this shift

## 2020-05-27 NOTE — Progress Notes (Signed)
[facility] MD Progress Note  05/27/2020 12:03 PM Christopher Morgan  MRN:  [id number]  Principal Problem: Psychosis [facility]) Diagnosis: Principal Problem:   Psychosis (HCC) Active Problems:   HIV positive (HCC)   History of syphilis  Total Time spent with patient: 30 min   Mr. Bolle is a  24y.o. male who presents to the [facility] unit for treatment of psychosis.   Interval History Patient was seen today for re-evaluation.  Pt called 911 yesterday multiple times saying somebody is trying to kill him  Subjective:  " I feel better today"  Today patient seen one-on-one .  Pt calmer, less paranoid today, not talking about people trying to kill him.  Denies hallucination. Pt less anxious, taking meds, tolerating well. Slept well.     Current suicidal/homicidal ideations: Denies Current auditory/visual hallucinations: Denies The patient reports no side effects from medications.    Labs: no new results for review.   Past Psychiatric History: per his mother,  as a child he had some odd behavior and some school refusal at times. Patient had no reported memory of it. No clear diagnosis. As an adult appears to have had no mental health issues reported  Past Medical History: History reviewed. No pertinent past medical history. History reviewed. No pertinent surgical history. Family History: History reviewed. No pertinent family history. Family Psychiatric  History: None Social History:  Social History   Substance and Sexual Activity  Alcohol Use No     Social History   Substance and Sexual Activity  Drug Use No    Social History   Socioeconomic History  . Marital status: Single    Spouse name: Not on file  . Number of children: Not on file  . Years of education: Not on file  . Highest education level: Not on file  Occupational History  . Not on file  Tobacco Use  . Smoking status: Current Every Day Smoker    Packs/day: 0.03    Types: Cigarettes  . Smokeless tobacco: Never Used  Substance  and Sexual Activity  . Alcohol use: No  . Drug use: No  . Sexual activity: Not on file  Other Topics Concern  . Not on file  Social History Narrative  . Not on file   Social Determinants of Health   Financial Resource Strain:   . Difficulty of Paying Living Expenses: Not on file  Food Insecurity:   . Worried About Programme researcher, broadcasting/film/video in the Last Year: Not on file  . Ran Out of Food in the Last Year: Not on file  Transportation Needs:   . Lack of Transportation (Medical): Not on file  . Lack of Transportation (Non-Medical): Not on file  Physical Activity:   . Days of Exercise per Week: Not on file  . Minutes of Exercise per Session: Not on file  Stress:   . Feeling of Stress : Not on file  Social Connections:   . Frequency of Communication with Friends and Family: Not on file  . Frequency of Social Gatherings with Friends and Family: Not on file  . Attends Religious Services: Not on file  . Active Member of Clubs or Organizations: Not on file  . Attends Banker Meetings: Not on file  . Marital Status: Not on file   Additional Social History:     Sleep: Fair  Appetite:  Fair  Current Medications: Current Facility-Administered Medications  Medication Dose Route Frequency Provider Last Rate Last Admin  . acetaminophen (TYLENOL) tablet 650  mg  650 mg Oral Q6H PRN Clapacs, John T, MD      . alum & mag hydroxide-simeth (MAALOX/MYLANTA) 200-200-20 MG/5ML suspension 30 mL  30 mL Oral Q4H PRN Clapacs, John T, MD      . bictegravir-emtricitabine-tenofovir AF (BIKTARVY) 50-200-25 MG per tablet 1 tablet  1 tablet Oral Daily Lynn Ito, MD   1 tablet at 05/27/20 0807  . divalproex (DEPAKOTE) DR tablet 500 mg  500 mg Oral Q12H [name], MD   500 mg at 05/27/20 4098  . hydrOXYzine (ATARAX/VISTARIL) tablet 50 mg  50 mg Oral TID PRN Clapacs, [name], MD   50 mg at 05/26/20 1837  . magnesium hydroxide (MILK OF MAGNESIA) suspension 30 mL  30 mL Oral Daily  PRN Clapacs, John T, MD      . risperiDONE (RISPERDAL) tablet 1 mg  1 mg Oral Daily [name], MD   1 mg at 05/27/20 0807  . risperiDONE (RISPERDAL) tablet 3 mg  3 mg Oral QHS [name], MD   3 mg at 05/26/20 2120  . traZODone (DESYREL) tablet 100 mg  100 mg Oral QHS PRN Clapacs, [name], MD   100 mg at 05/24/20 2107  . ziprasidone (GEODON) injection 20 mg  20 mg Intramuscular Q6H PRN [name], MD        Lab Results:  No results found for this or any previous visit (from the past 48 hour(s)).  Blood Alcohol level:  Lab Results  Component Value Date   ETH <10 05/16/2020    Metabolic Disorder Labs: Lab Results  Component Value Date   HGBA1C 5.0 05/18/2020   MPG 97 05/18/2020   No results found for: PROLACTIN Lab Results  Component Value Date   CHOL 131 05/18/2020   TRIG 105 05/18/2020   HDL 51 05/18/2020   CHOLHDL 2.6 05/18/2020   VLDL 21 05/18/2020   LDLCALC 59 05/18/2020    Physical Findings: AIMS: Facial and Oral Movements Muscles of Facial Expression: None, normal Lips and Perioral Area: None, normal Jaw: None, normal Tongue: None, normal,Extremity Movements Upper (arms, wrists, hands, fingers): None, normal Lower (legs, knees, ankles, toes): None, normal, Trunk Movements Neck, shoulders, hips: None, normal, Overall Severity Severity of abnormal movements (highest score from questions above): None, normal Incapacitation due to abnormal movements: None, normal Patient's awareness of abnormal movements (rate only patient's report): No Awareness, Dental Status Current problems with teeth and/or dentures?: No Does patient usually wear dentures?: No  CIWA:    COWS:     Musculoskeletal: Strength & Muscle Tone: within normal limits Gait & Station: normal Patient leans: N/A  Psychiatric Specialty Exam: Physical Exam Vitals and nursing note reviewed.  Constitutional:      Appearance: Normal appearance.  HENT:     Head: Normocephalic and  atraumatic.     Right Ear: External ear normal.     Left Ear: External ear normal.     Mouth/Throat:     Mouth: Mucous membranes are moist.  Eyes:     Conjunctiva/sclera: Conjunctivae normal.  Cardiovascular:     Rate and Rhythm: Normal rate.  Pulmonary:     Effort: Pulmonary effort is normal.  Musculoskeletal:        General: No swelling. Normal range of motion.     Cervical back: Normal range of motion and neck supple.  Neurological:     General: No focal deficit present.     Mental Status: He is alert and oriented to  person, place, and time.  Psychiatric:        Attention and Perception: He is attentive.        Mood and Affect: Mood is anxious. Affect is labile.        Speech: Speech is rapid and pressured.        Behavior: Behavior is agitated.        Thought Content: Thought content is paranoid and delusional. Thought content includes suicidal ideation.        Cognition and Memory: Cognition is impaired. Memory is impaired.        Judgment: Judgment is impulsive.     Review of Systems  Constitutional: Negative for appetite change and fatigue.  HENT: Negative for rhinorrhea and sore throat.   Eyes: Negative for photophobia and visual disturbance.  Respiratory: Negative for cough and shortness of breath.   Cardiovascular: Negative for chest pain and palpitations.  Gastrointestinal: Negative for constipation, diarrhea, nausea and vomiting.  Endocrine: Negative for cold intolerance and heat intolerance.  Genitourinary: Negative for difficulty urinating and dysuria.  Musculoskeletal: Negative for arthralgias and back pain.  Skin: Negative for rash and wound.  Allergic/Immunologic: Negative for environmental allergies and food allergies.  Neurological: Negative for dizziness and headaches.  Hematological: Negative for adenopathy. Does not bruise/bleed easily.  Psychiatric/Behavioral: Positive for suicidal ideas. The patient is nervous/anxious and is hyperactive.     Blood  pressure 115/84, pulse 81, temperature 98.5 F (36.9 C), temperature source Oral, resp. rate 18, height 5\' 7"  (1.702 m), weight 58.1 kg, SpO2 100 %.Body mass index is 20.05 kg/m.  General Appearance: Casual  Eye Contact:  Good  Speech: pressured  Volume:  Normal  Mood:  anxious  Affect:  labile  Thought Process: disorganized  Orientation:  Full (Time, Place, and Person)  Thought Content:  Less  paranoid that people trying to kill him, denies hallucination  Suicidal Thoughts:  denies  Homicidal Thoughts:  No  Memory:  Immediate;   Fair Recent;   Fair Remote;   NA  Judgement: limited  Insight:  Limited  Psychomotor Activity:  Normal  Concentration:  Concentration: Fair and Attention Span: Fair  Recall:  of Knowledge:  Fair  Language:  Fair  Akathisia:  No  Handed:  Right  AIMS (if indicated):     Assets:  Communication Skills Desire for Improvement  ADL's:  Intact  Cognition:  Impaired,  Mild  Sleep:  Number of Hours: 7.5     Treatment Plan Summary: Daily contact with patient to assess and evaluate symptoms and progress in treatment and Medication management Patient is a 24 year old male/male with the above-stated past psychiatric history who is seen in follow-up.  Chart reviewed. Patient discussed with nursing. Patient is  paranoid  delusional.  Called his mother for update, the mother appreciated for the update, she says pt will come  home when he is stabilized.    Cont  Risperdal 1 mg in the morning, 3 mg at night, decreasing  Abilify with plan to discontinue due to lack of efficacy.   Depakote 500 mg BID for mood stabilization. will continue to observe patient`s behavior and thought content.   Plan: -continue inpatient psych admission; 15-minute checks; daily contact with patient to assess and evaluate symptoms and progress in treatment; psychoeducation.  -continue scheduled medications: . bictegravir-emtricitabine-tenofovir AF  1 tablet Oral Daily  .  divalproex  500 mg Oral Q12H  . risperiDONE  1 mg Oral Daily  . risperiDONE  3  mg Oral QHS   -continue PRN medications.  acetaminophen, alum & mag hydroxide-simeth, hydrOXYzine, magnesium hydroxide, traZODone, ziprasidone   -Pertinent Labs: no new labs ordered today  -EKG: on 10/28 showed Qtc 385 with normal sinus rhythm    -Consults: patient followed by ID consult service. Will given IM Penicillin today, and will require 2 more shots. Currently declining lumbar puncture. [facility]. Will Contact Laurette SchimkeJulie Willis, case manager, at [phone] to set up an appointment prior to discharge.      -Disposition: patient is not ready for discharge yet. -  I certify that the patient does need, on a daily basis, active treatment furnished directly by or requiring the supervision of inpatient psychiatric facility personnel.   Beverly SessionsJagannath Deaundre Allston, MD 05/27/2020, 12:03 PMPatient ID: Bonnee QuinSamuel Morgan, male   DOB: Sep 22, 1995, 24 y.o.   MRN: [id number] Patient ID: Bonnee QuinSamuel Morgan, male   DOB: Sep 22, 1995, 24 y.o.   MRN: [id number]

## 2020-05-28 ENCOUNTER — Other Ambulatory Visit: Payer: Self-pay | Admitting: Behavioral Health

## 2020-05-28 ENCOUNTER — Other Ambulatory Visit: Payer: Self-pay | Admitting: Infectious Diseases

## 2020-05-28 MED ORDER — BICTEGRAVIR-EMTRICITAB-TENOFOV 50-200-25 MG PO TABS: 1.0000 | ORAL_TABLET | Freq: Every day | ORAL | 0 refills | Status: DC

## 2020-05-28 MED ORDER — DIVALPROEX SODIUM 500 MG PO DR TAB: 500.0000 mg | DELAYED_RELEASE_TABLET | Freq: Two times a day (BID) | ORAL | 0 refills | Status: DC

## 2020-05-28 MED ORDER — RISPERIDONE 2 MG PO TABS: 2.0000 mg | ORAL_TABLET | Freq: Two times a day (BID) | ORAL | 0 refills | Status: DC

## 2020-05-28 MED ORDER — PENICILLIN G BENZATHINE 1200000 UNIT/2ML IM SUSP
2.4000 10*6.[IU] | Freq: Once | INTRAMUSCULAR | Status: AC
  Administered 2020-05-29: 2.4 10*6.[IU] via INTRAMUSCULAR
  Filled 2020-05-28: qty 4

## 2020-05-28 MED ORDER — TRAZODONE HCL 100 MG PO TABS: 100.0000 mg | ORAL_TABLET | Freq: Every evening | ORAL | 0 refills | Status: DC | PRN

## 2020-05-28 MED FILL — BIKTARVY 50-200-25 MG TABS: 50-200-25 | 30 days supply | Qty: 30 | Fill #0

## 2020-05-28 NOTE — [facility] Suicide Risk Assessment (Signed)
[facility] Discharge Suicide Risk Assessment   Principal Problem: Brief psychotic disorder [facility]) Discharge Diagnoses: Principal Problem:   Brief psychotic disorder (HCC) Active Problems:   HIV positive (HCC)   History of syphilis   Total Time spent with patient: 30 minutes  Musculoskeletal: Strength & Muscle Tone: within normal limits Gait & Station: normal Patient leans: N/A  Psychiatric Specialty Exam: Review of Systems  Constitutional: Positive for fatigue. Negative for appetite change.  HENT: Negative for rhinorrhea and sore throat.   Eyes: Negative for photophobia and visual disturbance.  Respiratory: Negative for cough and shortness of breath.   Cardiovascular: Negative for chest pain and palpitations.  Gastrointestinal: Negative for constipation, diarrhea, nausea and vomiting.  Endocrine: Negative for cold intolerance and heat intolerance.  Genitourinary: Negative for difficulty urinating and dysuria.  Musculoskeletal: Negative for arthralgias and myalgias.  Skin: Negative for rash and wound.  Allergic/Immunologic: Negative for environmental allergies and food allergies.  Neurological: Negative for dizziness and headaches.  Hematological: Negative for adenopathy. Does not bruise/bleed easily.  Psychiatric/Behavioral: Negative for dysphoric mood, hallucinations and suicidal ideas. The patient is not nervous/anxious.     Blood pressure 121/86, pulse 74, temperature 97.6 F (36.4 C), temperature source Oral, resp. rate 18, height 5\' 7"  (1.702 m), weight 58.1 kg, SpO2 100 %.Body mass index is 20.05 kg/m.  General Appearance: Casual and Fairly Groomed  ::  Good  Speech:  Clear and Coherent  Volume:  Normal  Mood:  Euthymic  Affect:  Appropriate and Congruent  Thought Process:  Coherent  Orientation:  Full (Time, Place, and Person)  Thought Content:  Logical  Suicidal Thoughts:  No  Homicidal Thoughts:  No  Memory:  Immediate;   Fair Recent;   Fair Remote;    Fair  Judgement:  Intact  Insight:  Fair  Psychomotor Activity:  Normal  Concentration:  Fair  Recall:  [phone] of Knowledge:Fair  Language: Fair  Akathisia:  Negative  Handed:  Right  AIMS (if indicated):     Assets:  Communication Skills Desire for Improvement Financial Resources/Insurance Housing Resilience Social Support Talents/Skills Transportation Vocational/Educational  Sleep:  Number of Hours: 8  Cognition: WNL  ADL's:  Intact   Mental Status Per Nursing Assessment::   On Admission:  NA  Demographic Factors:  Male, Adolescent or young adult and Gay, lesbian, or bisexual orientation  Loss Factors: NA  Historical Factors: Impulsivity  Risk Reduction Factors:   Sense of responsibility to family, Employed, Living with another person, especially a relative, Positive social support, Positive therapeutic relationship and Positive coping skills or problem solving skills  Continued Clinical Symptoms:  Previous Psychiatric Diagnoses and Treatments Medical Diagnoses and Treatments/Surgeries  Cognitive Features That Contribute To Risk:  None    Suicide Risk:  Minimal: No identifiable suicidal ideation.  Patients presenting with no risk factors but with morbid ruminations; may be classified as minimal risk based on the severity of the depressive symptoms   Follow-up Information    [facility] Follow up.   Why: Your appointment is scheduled for 06/05/2020 with 06/07/2020, Peer Support Specialist at 7:30Am via Zoom.  Thanks! Contact information: [address] [place] [phone]               Plan Of Care/Follow-up recommendations:  Activity:  as tolerated Diet:  regular diet  [phone], MD 05/29/2020, 9:15 AM

## 2020-05-28 NOTE — Plan of Care (Signed)
D- Patient alert and oriented. Patient presents in a pleasant mood on assessment stating that he slept "really great" last night and had no complaints to voice to this Clinical research associate. Patient denies SI, HI, AVH, and pain at this time. Patient also denies signs/symptoms of depression/anxiety, reporting that overall, he is feeling "100 % great". Patient's goal for today is to "be on my best behavior", in which he will "control the way I'm effected by others".  A- Scheduled medications administered to patient, per MD orders. Support and encouragement provided.  Routine safety checks conducted every 15 minutes.  Patient informed to notify staff with problems or concerns.  R- No adverse drug reactions noted. Patient contracts for safety at this time. Patient compliant with medications and treatment plan. Patient receptive, calm, and cooperative. Patient interacts well with others on the unit.  Patient remains safe at this time.  Problem: Education: Goal: Knowledge of [place] [facility] Education information/materials will improve Outcome: Progressing   Problem: Health Behavior/Discharge Planning: Goal: Identification of resources available to assist in meeting health care needs will improve Outcome: Progressing Goal: Compliance with treatment plan for underlying cause of condition will improve Outcome: Progressing   Problem: Safety: Goal: Periods of time without injury will increase Outcome: Progressing   Problem: Coping: Goal: Coping ability will improve Outcome: Progressing Goal: Will verbalize feelings Outcome: Progressing   Problem: Self-Concept: Goal: Level of anxiety will decrease Outcome: Progressing   Problem: Education: Goal: Will be free of psychotic symptoms Outcome: Progressing Goal: Knowledge of the prescribed therapeutic regimen will improve Outcome: Progressing   Problem: Health Behavior/Discharge Planning: Goal: Compliance with prescribed medication regimen will  improve Outcome: Progressing

## 2020-05-28 NOTE — Tx Team (Signed)
Interdisciplinary Treatment and Diagnostic Plan Update  05/28/2020 Time of Session: 9:00AM Christopher Morgan MRN: [id number]  Principal Diagnosis: Psychosis [facility])  Secondary Diagnoses: Principal Problem:   Psychosis (HCC) Active Problems:   HIV positive (HCC)   History of syphilis   Current Medications:  Current Facility-Administered Medications  Medication Dose Route Frequency Provider Last Rate Last Admin  . acetaminophen (TYLENOL) tablet 650 mg  650 mg Oral Q6H PRN Clapacs, John T, MD      . alum & mag hydroxide-simeth (MAALOX/MYLANTA) 200-200-20 MG/5ML suspension 30 mL  30 mL Oral Q4H PRN Clapacs, John T, MD      . bictegravir-emtricitabine-tenofovir AF (BIKTARVY) 50-200-25 MG per tablet 1 tablet  1 tablet Oral Daily Lynn Ito, MD   1 tablet at 05/28/20 0804  . divalproex (DEPAKOTE) DR tablet 500 mg  500 mg Oral Q12H [name], MD   500 mg at 05/28/20 0804  . hydrOXYzine (ATARAX/VISTARIL) tablet 50 mg  50 mg Oral TID PRN Clapacs, [name], MD   50 mg at 05/26/20 1837  . magnesium hydroxide (MILK OF MAGNESIA) suspension 30 mL  30 mL Oral Daily PRN Clapacs, John T, MD      . risperiDONE (RISPERDAL) tablet 1 mg  1 mg Oral Daily [name], MD   1 mg at 05/28/20 0804  . risperiDONE (RISPERDAL) tablet 3 mg  3 mg Oral QHS [name], MD   3 mg at 05/27/20 2118  . traZODone (DESYREL) tablet 100 mg  100 mg Oral QHS PRN Clapacs, [name], MD   100 mg at 05/24/20 2107  . ziprasidone (GEODON) injection 20 mg  20 mg Intramuscular Q6H PRN [name], MD       PTA Medications: Medications Prior to Admission  Medication Sig Dispense Refill Last Dose  . azithromycin (ZITHROMAX Z-PAK) 250 MG tablet Take 2 tablets (500 mg) on  Day 1,  followed by 1 tablet (250 mg) once daily on Days 2 through 5. (Patient not taking: Reported on 05/16/2020) 6 each 0   . brompheniramine-pseudoephedrine-DM 30-2-10 MG/5ML syrup Take 10 mLs by mouth 4 (four) times daily as needed. (Patient  not taking: Reported on 05/16/2020) 200 mL 0   . cephALEXin (KEFLEX) 500 MG capsule Take 1 capsule (500 mg total) by mouth 3 (three) times daily. (Patient not taking: Reported on 05/16/2020) 21 capsule 0   . HYDROcodone-acetaminophen (NORCO) 5-325 MG tablet Take 1 tablet by mouth every 6 (six) hours as needed for moderate pain. (Patient not taking: Reported on 05/16/2020) 15 tablet 0   . ibuprofen (ADVIL,MOTRIN) 600 MG tablet Take 1 tablet (600 mg total) by mouth every 8 (eight) hours as needed. (Patient not taking: Reported on 05/16/2020) 15 tablet 0   . traMADol (ULTRAM) 50 MG tablet Take 1 tablet (50 mg total) by mouth every 6 (six) hours as needed for moderate pain. (Patient not taking: Reported on 05/16/2020) 12 tablet 0     Patient Stressors: Financial difficulties Substance abuse  Patient Strengths: Wellsite geologist fund of knowledge Supportive family/friends Work skills  Treatment Modalities: Medication Management, Group therapy, Case management,  1 to 1 session with clinician, Psychoeducation, Recreational therapy.   Physician Treatment Plan for Primary Diagnosis: Psychosis (HCC) Long Term Goal(s): Improvement in symptoms so as ready for discharge Improvement in symptoms so as ready for discharge   Short Term Goals: Ability to identify changes in lifestyle to reduce recurrence of condition will improve Ability to verbalize feelings will improve Ability  to demonstrate self-control will improve Ability to identify and develop effective coping behaviors will improve Compliance with prescribed medications will improve Ability to identify triggers associated with substance abuse/mental health issues will improve Ability to identify changes in lifestyle to reduce recurrence of condition will improve Compliance with prescribed medications will improve  Medication Management: Evaluate patient's response, side effects, and tolerance of medication regimen.  Therapeutic  Interventions: 1 to 1 sessions, Unit Group sessions and Medication administration.  Evaluation of Outcomes: Progressing  Physician Treatment Plan for Secondary Diagnosis: Principal Problem:   Psychosis (HCC) Active Problems:   HIV positive (HCC)   History of syphilis  Long Term Goal(s): Improvement in symptoms so as ready for discharge Improvement in symptoms so as ready for discharge   Short Term Goals: Ability to identify changes in lifestyle to reduce recurrence of condition will improve Ability to verbalize feelings will improve Ability to demonstrate self-control will improve Ability to identify and develop effective coping behaviors will improve Compliance with prescribed medications will improve Ability to identify triggers associated with substance abuse/mental health issues will improve Ability to identify changes in lifestyle to reduce recurrence of condition will improve Compliance with prescribed medications will improve     Medication Management: Evaluate patient's response, side effects, and tolerance of medication regimen.  Therapeutic Interventions: 1 to 1 sessions, Unit Group sessions and Medication administration.  Evaluation of Outcomes: Progressing   RN Treatment Plan for Primary Diagnosis: Psychosis (HCC) Long Term Goal(s): Knowledge of disease and therapeutic regimen to maintain health will improve  Short Term Goals: Ability to verbalize frustration and anger appropriately will improve, Ability to demonstrate self-control, Ability to participate in decision making will improve, Ability to verbalize feelings will improve, Ability to identify and develop effective coping behaviors will improve and Compliance with prescribed medications will improve  Medication Management: RN will administer medications as ordered by provider, will assess and evaluate patient's response and provide education to patient for prescribed medication. RN will report any adverse and/or  side effects to prescribing provider.  Therapeutic Interventions: 1 on 1 counseling sessions, Psychoeducation, Medication administration, Evaluate responses to treatment, Monitor vital signs and CBGs as ordered, Perform/monitor CIWA, COWS, AIMS and Fall Risk screenings as ordered, Perform wound care treatments as ordered.  Evaluation of Outcomes: Progressing   LCSW Treatment Plan for Primary Diagnosis: Psychosis (HCC) Long Term Goal(s): Safe transition to appropriate next level of care at discharge, Engage patient in therapeutic group addressing interpersonal concerns.  Short Term Goals: Engage patient in aftercare planning with referrals and resources, Increase social support, Increase ability to appropriately verbalize feelings, Increase emotional regulation, Facilitate acceptance of mental health diagnosis and concerns, Identify triggers associated with mental health/substance abuse issues and Increase skills for wellness and recovery  Therapeutic Interventions: Assess for all discharge needs, 1 to 1 time with Social worker, Explore available resources and support systems, Assess for adequacy in community support network, Educate family and significant other(s) on suicide prevention, Complete Psychosocial Assessment, Interpersonal group therapy.  Evaluation of Outcomes: Progressing   Progress in Treatment: Attending groups: Yes. Participating in groups: Yes. Taking medication as prescribed: Yes. Toleration medication: Yes. Family/Significant other contact made: Yes, individual(s) contacted:  SPE completed with patient and mother. Patient understands diagnosis: Yes. Discussing patient identified problems/goals with staff: Yes. Medical problems stabilized or resolved: Yes. Denies suicidal/homicidal ideation: Yes. Issues/concerns per patient self-inventory: No. Other: None.  New problem(s) identified: No, Describe:  none.  New Short Term/Long Term Goal(s): elimination of symptoms of  mania,  medication management for mood stabilization; development of comprehensive mental wellness plan.  Update 05/23/2020:  No updates at this time. Update 05/28/2020:  No updates at this time.  Patient Goals: "I miss my mother and my sister. I would like to go home."  Update 05/23/2020:  No updates at this time. Update 05/28/2020:  No updates at this time.  Discharge Plan or Barriers: Patient to discharge home with follow up through [facility]. Update 05/23/2020:  No updates at this time. Update 05/28/2020:  Patient reports plans to return to his home.  He reports plans to follow up with outpatient treatment. Patient does continue to struggle with stability and structure while on the unit.   Reason for Continuation of Hospitalization: Mania Medical Issues Medication stabilization  Estimated Length of Stay: 1-7 days  Attendees: Patient:  05/28/2020 9:06 AM  Physician: Les Pou, MD 05/28/2020 9:06 AM  Nursing:  05/28/2020 9:06 AM  RN Care Manager: 05/28/2020 9:06 AM  Social Worker: Penni Homans, MSW, LCSW 05/28/2020 9:06 AM  Recreational Therapist:  05/28/2020 9:06 AM  Other: Vilma Meckel. Algis Greenhouse, MSW, Harvey, LCAS 05/28/2020 9:06 AM  Other:  05/28/2020 9:06 AM  Other: 05/28/2020 9:06 AM    Scribe for Treatment Team: Harden Mo, LCSW 05/28/2020 9:06 AM

## 2020-05-28 NOTE — TOC Benefit Eligibility Note (Addendum)
Patient Advocate Encounter  Completed and sent [employer] Advancing Access application for Biktarvy for this patient who is uninsured.    Patient is approved for a 30 day supply.  BIN      [id number] PCN    [id number] GRP    [id number] ID        [id number]    Roland Earl, CPhT Certified Pharmacy Technician- Transitions of Care [facility] Antimicrobial Stewardship Team Direct Number: [phone]  Fax: [phone]

## 2020-05-28 NOTE — [facility] Group Notes (Signed)
LCSW Group Therapy Note   05/28/2020 2:07 PM  Type of Therapy and Topic:  Group Therapy:  Overcoming Obstacles   Participation Level:  Minimal   Description of Group:    In this group patients will be encouraged to explore what they see as obstacles to their own wellness and recovery. They will be guided to discuss their thoughts, feelings, and behaviors related to these obstacles. The group will process together ways to cope with barriers, with attention given to specific choices patients can make. Each patient will be challenged to identify changes they are motivated to make in order to overcome their obstacles. This group will be process-oriented, with patients participating in exploration of their own experiences as well as giving and receiving support and challenge from other group members.   Therapeutic Goals: 1. Patient will identify personal and current obstacles as they relate to admission. 2. Patient will identify barriers that currently interfere with their wellness or overcoming obstacles.  3. Patient will identify feelings, thought process and behaviors related to these barriers. 4. Patient will identify two changes they are willing to make to overcome these obstacles:      Summary of Patient Progress Pt participated in the group discussion. His comments were pertinent to the discussion. He was able to identify his emotions/behavior as a personal/current obstacles related to admission. Outside of this patient did not engage much in the discussion.     Therapeutic Modalities:   Cognitive Behavioral Therapy Solution Focused Therapy Motivational Interviewing Relapse Prevention Therapy  Simona Huh R. Algis Greenhouse, MSW, LCSW, LCAS 05/28/2020 2:07 PM

## 2020-05-28 NOTE — Progress Notes (Signed)
[facility]  05/28/2020 9:56 AM Christopher QuinSamuel Morgan  MRN:  [id number]  Principal Problem: Psychosis [facility](HCC) Diagnosis: Principal Problem:   Psychosis (HCC) Active Problems:   HIV positive (HCC)   History of syphilis  Total Time spent with patient: 30 min   Christopher Morgan is a  24y.o. male [facility].   Interval History Patient was seen today for re-evaluation.  Pt called 911 yesterday multiple times saying somebody is trying to kill him on Saturday, but had a good day yesterday. No behavioral issues yesterday or overnight, no additional calls to 911.   Subjective:  " I feel better today"  Today patient seen one-on-one. Without prompting, patient specifically states he no longer feels that people are trying to kill him in the hospital. He states he felt like he "went off the deep end", but is feeling a lot better today. He denies suicidal ideations, homicidal ideations, visual hallucinations, or auditory hallucinations. He states he feels calmer now, and is sleeping better. He feels he still has enough energy to participate in groups and daily activities. Informed him that I will be checking a depakote level in the morning, and he is agreeable to this plan.    Labs: no new results for review.   Past Psychiatric History: per his mother,  as a child he had some odd behavior and some school refusal at times. Patient had no reported memory of it. No clear diagnosis. As an adult appears to have had no mental health issues reported  Past Medical History: History reviewed. No pertinent past medical history. History reviewed. No pertinent surgical history. Family History: History reviewed. No pertinent family history. Family Psychiatric  History: None Social History:  Social History   Substance and Sexual Activity  Alcohol Use No     Social History   Substance and Sexual Activity  Drug Use No    Social History   Socioeconomic History  .  Marital status: Single    Spouse name: Not on file  . Number of children: Not on file  . Years of education: Not on file  . Highest education level: Not on file  Occupational History  . Not on file  Tobacco Use  . Smoking status: Current Every Day Smoker    Packs/day: 0.03    Types: Cigarettes  . Smokeless tobacco: Never Used  Substance and Sexual Activity  . Alcohol use: No  . Drug use: No  . Sexual activity: Not on file  Other Topics Concern  . Not on file  Social History Narrative  . Not on file   Social Determinants of Health   Financial Resource Strain:   . Difficulty of Paying Living Expenses: Not on file  Food Insecurity:   . Worried About Programme researcher, broadcasting/film/videounning Out of Food in the Last Year: Not on file  . Ran Out of Food in the Last Year: Not on file  Transportation Needs:   . Lack of Transportation (Medical): Not on file  . Lack of Transportation (Non-Medical): Not on file  Physical Activity:   . Days of Exercise per Week: Not on file  . Minutes of Exercise per Session: Not on file  Stress:   . Feeling of Stress : Not on file  Social Connections:   . Frequency of Communication with Friends and Family: Not on file  . Frequency of Social Gatherings with Friends and Family: Not on file  . Attends Religious Services: Not on file  .  Active Member of Clubs or Organizations: Not on file  . Attends Banker Meetings: Not on file  . Marital Status: Not on file   Additional Social History:     Sleep: Fair  Appetite:  Fair  Current Medications: Current Facility-Administered Medications  Medication Dose Route Frequency Provider Last Rate Last Admin  . acetaminophen (TYLENOL) tablet 650 mg  650 mg Oral Q6H PRN Christopher Morgan, Christopher T, MD      . alum & mag hydroxide-simeth (MAALOX/MYLANTA) 200-200-20 MG/5ML suspension 30 mL  30 mL Oral Q4H PRN Christopher Morgan, Christopher T, MD      . bictegravir-emtricitabine-tenofovir AF (BIKTARVY) 50-200-25 MG per tablet 1 tablet  1 tablet Oral Daily  Christopher Ito, MD   1 tablet at 05/28/20 0804  . divalproex (DEPAKOTE) DR tablet 500 mg  500 mg Oral Q12H Christopher Sans, MD   500 mg at 05/28/20 0804  . hydrOXYzine (ATARAX/VISTARIL) tablet 50 mg  50 mg Oral TID PRN Christopher Morgan, Christopher Denmark, MD   50 mg at 05/26/20 1837  . magnesium hydroxide (MILK OF MAGNESIA) suspension 30 mL  30 mL Oral Daily PRN Christopher Morgan, Christopher T, MD      . risperiDONE (RISPERDAL) tablet 1 mg  1 mg Oral Daily Christopher Sans, MD   1 mg at 05/28/20 0804  . risperiDONE (RISPERDAL) tablet 3 mg  3 mg Oral QHS Christopher Sans, MD   3 mg at 05/27/20 2118  . traZODone (DESYREL) tablet 100 mg  100 mg Oral QHS PRN Christopher Morgan, Christopher Denmark, MD   100 mg at 05/24/20 2107  . ziprasidone (GEODON) injection 20 mg  20 mg Intramuscular Q6H PRN Christopher Sans, MD        Lab Results:  No results found for this or any previous visit (from the past 48 hour(s)).  Blood Alcohol level:  Lab Results  Component Value Date   ETH <10 05/16/2020    Metabolic Disorder Labs: Lab Results  Component Value Date   HGBA1C 5.0 05/18/2020   MPG 97 05/18/2020   No results found for: PROLACTIN Lab Results  Component Value Date   CHOL 131 05/18/2020   TRIG 105 05/18/2020   HDL 51 05/18/2020   CHOLHDL 2.6 05/18/2020   VLDL 21 05/18/2020   LDLCALC 59 05/18/2020    Physical Findings: AIMS: Facial and Oral Movements Muscles of Facial Expression: None, normal Lips and Perioral Area: None, normal Jaw: None, normal Tongue: None, normal,Extremity Movements Upper (arms, wrists, hands, fingers): None, normal Lower (legs, knees, ankles, toes): None, normal, Trunk Movements Neck, shoulders, hips: None, normal, Overall Severity Severity of abnormal movements (highest score from questions above): None, normal Incapacitation due to abnormal movements: None, normal Patient's awareness of abnormal movements (rate only patient's report): No Awareness, Dental Status Current problems with teeth and/or dentures?:  No Does patient usually wear dentures?: No  CIWA:    COWS:     Musculoskeletal: Strength & Muscle Tone: within normal limits Gait & Station: normal Patient leans: N/A  Psychiatric Specialty Exam: Physical Exam Vitals and nursing note reviewed.  Constitutional:      Appearance: Normal appearance.  HENT:     Head: Normocephalic and atraumatic.     Right Ear: External ear normal.     Left Ear: External ear normal.     Mouth/Throat:     Mouth: Mucous membranes are moist.  Eyes:     Conjunctiva/sclera: Conjunctivae normal.  Cardiovascular:     Rate and Rhythm: Normal rate.  Pulmonary:     Effort: Pulmonary effort is normal.  Musculoskeletal:        General: No swelling. Normal range of motion.     Cervical back: Normal range of motion and neck supple.  Neurological:     General: No focal deficit present.     Mental Status: He is alert and oriented to person, place, and time.  Psychiatric:        Attention and Perception: He is attentive.        Mood and Affect: Mood is anxious. Affect is not labile or tearful.        Speech: Speech is not rapid and pressured.        Behavior: Behavior is not agitated. Behavior is cooperative.        Thought Content: Thought content is not paranoid or delusional. Thought content does not include suicidal ideation.        Cognition and Memory: Cognition is not impaired. Memory is not impaired.        Judgment: Judgment is not impulsive.     Review of Systems  Constitutional: Negative for appetite change and fatigue.  HENT: Negative for rhinorrhea and sore throat.   Eyes: Negative for photophobia and visual disturbance.  Respiratory: Negative for cough and shortness of breath.   Cardiovascular: Negative for chest pain and palpitations.  Gastrointestinal: Negative for constipation, diarrhea, nausea and vomiting.  Endocrine: Negative for cold intolerance and heat intolerance.  Genitourinary: Negative for difficulty urinating and dysuria.   Musculoskeletal: Negative for arthralgias and back pain.  Skin: Negative for rash and wound.  Allergic/Immunologic: Negative for environmental allergies and food allergies.  Neurological: Negative for dizziness and headaches.  Hematological: Negative for adenopathy. Does not bruise/bleed easily.  Psychiatric/Behavioral: Negative for suicidal ideas. The patient is nervous/anxious and is hyperactive.     Blood pressure 125/84, pulse 81, temperature 98 F (36.7 C), temperature source Oral, resp. rate 18, height 5\' 7"  (1.702 m), weight 58.1 kg, SpO2 100 %.Body mass index is 20.05 kg/m.  General Appearance: Casual  Eye Contact:  Good  Speech: Increased, but much less pressured  Volume:  Normal  Mood:  anxious  Affect: Euthymic  Thought Process: organized  Orientation:  Full (Time, Place, and Person)  Thought Content:  Denies paranoia or delusions that people are trying to kill him  Suicidal Thoughts:  denies  Homicidal Thoughts:  No  Memory:  Immediate;   Fair Recent;   Fair Remote;   NA  Judgement: limited  Insight:  Limited  Psychomotor Activity:  Normal  Concentration:  Concentration: Fair and Attention Span: Fair  Recall:  of Knowledge:  Fair  Language:  Fair  Akathisia:  No  Handed:  Right  AIMS (if indicated):     Assets:  Communication Skills Desire for Improvement  ADL's:  Intact  Cognition:  Impaired,  Mild  Sleep:  Number of Hours: 8.25     Treatment Plan Summary: Daily contact with patient to assess and evaluate symptoms and progress in treatment and Medication management Patient is a 24 year old male/male with the above-stated past psychiatric history who is seen in follow-up.  Chart reviewed. Patient discussed with nursing. Patient is no longer paranoid and delusional.  Cont  Risperdal 1 mg in the morning, 3 mg at night, Abilify discontinued due to lack of efficacy.   Depakote 500 mg BID for mood stabilization. will continue to observe patient`s  behavior and thought content. Will check valproic acid level tomorrow  morning.    Plan: -continue inpatient psych admission; 15-minute checks; daily contact with patient to assess and evaluate symptoms and progress in treatment; psychoeducation.  -continue scheduled medications: . bictegravir-emtricitabine-tenofovir AF  1 tablet Oral Daily  . divalproex  500 mg Oral Q12H  . risperiDONE  1 mg Oral Daily  . risperiDONE  3 mg Oral QHS   -continue PRN medications.  acetaminophen, alum & mag hydroxide-simeth, hydrOXYzine, magnesium hydroxide, traZODone, ziprasidone   -Pertinent Labs: no new labs ordered today  -EKG: on 10/28 showed Qtc 385 with normal sinus rhythm    -Consults: patient followed by ID consult service. Will given IM Penicillin today, and will require 2 more shots. Currently declining lumbar puncture. Lab results sent to [facility] to coordinate aftercare. Will Contact Laurette Schimke, case manager, at [phone] to set up an appointment prior to discharge.      - Disposition: patient is not ready for discharge yet. -  I certify that the patient does need, on a daily basis, active treatment furnished directly by or requiring the supervision of inpatient psychiatric facility personnel.   Christopher Sans, MD 05/28/2020, 9:56 AMPatient ID: Christopher Morgan, male   DOB: 10/12/1995, 24 y.o.   MRN: [id number] Patient ID: Christopher Morgan, male   DOB: Aug 18, 1995, 24 y.o.   MRN: [id number]

## 2020-05-28 NOTE — Plan of Care (Signed)
Patient presents at his baseline, scheduled to leave tomorrow  Problem: Self-Concept: Goal: Level of anxiety will decrease Outcome: Progressing

## 2020-05-28 NOTE — Progress Notes (Signed)
Patient calm and pleasant during assessment. Patient presents with a better affect than when this writer saw him last week. Patient observed interacting appropriately with staff and peers on the unit. Patient given education, support, and encouragement to be active in his treatment plan. Patient is scheduled to leave tomorrow and patient stated to this writer that he is ready. Patient being monitored Q 15 minutes for safety per unit protocol. Pt remains safe on the unit.

## 2020-05-29 LAB — VALPROIC ACID LEVEL: Valproic Acid Lvl: 93 ug/mL (ref 50.0–100.0)

## 2020-05-29 MED ORDER — RISPERIDONE 2 MG PO TABS: 2.0000 mg | ORAL_TABLET | Freq: Two times a day (BID) | ORAL | 1 refills | Status: AC

## 2020-05-29 MED ORDER — DIVALPROEX SODIUM 500 MG PO DR TAB: 500.0000 mg | DELAYED_RELEASE_TABLET | Freq: Two times a day (BID) | ORAL | 1 refills | Status: AC

## 2020-05-29 MED ORDER — DIVALPROEX SODIUM 500 MG PO DR TAB: 500.0000 mg | DELAYED_RELEASE_TABLET | Freq: Two times a day (BID) | ORAL | 1 refills | Status: DC

## 2020-05-29 MED ORDER — TRAZODONE HCL 100 MG PO TABS: 100.0000 mg | ORAL_TABLET | Freq: Every evening | ORAL | 1 refills | Status: DC | PRN

## 2020-05-29 MED ORDER — RISPERIDONE 2 MG PO TABS: 2.0000 mg | ORAL_TABLET | Freq: Two times a day (BID) | ORAL | 1 refills | Status: DC

## 2020-05-29 MED ORDER — TRAZODONE HCL 100 MG PO TABS: 100.0000 mg | ORAL_TABLET | Freq: Every evening | ORAL | 1 refills | Status: AC | PRN

## 2020-05-29 NOTE — Progress Notes (Signed)
  [facility] Adult Case Management Discharge Plan :  Will you be returning to the same living situation after discharge:  Yes,  pt plans to return home.  At discharge, do you have transportation home?: Yes,  pt reports mother to provide transportation.  Do you have the ability to pay for your medications: No.  Release of information consent forms completed and in the chart;  Patient's signature needed at discharge.  Patient to Follow up at:  Follow-up Information    [facility] Follow up.   Why: Your appointment is scheduled for 06/05/2020 with Lorella Nimrod, Peer Support Specialist at 7:30Am via Zoom.  Thanks! Contact information: [address] [place] [address] [phone]               Next level of care provider has access to [facility] Link:no  Safety Planning and Suicide Prevention discussed: Yes,  SPE completed with mother, [place] No.     Has patient been referred to the Quitline?: Patient refused referral  Patient has been referred for addiction treatment: Pt. refused referral  Glenis Smoker, LCSW 05/29/2020, 8:57 AM

## 2020-05-29 NOTE — Progress Notes (Signed)
Patient denies SI/HI, denies A/V hallucinations. Patient verbalizes understanding of discharge instructions, follow up care and prescriptions. Patient given all belongings from [facility] locker. Patient escorted out by staff, transported by family. 

## 2020-05-29 NOTE — Discharge Summary (Signed)
Physician Discharge Summary Note  Patient:  Christopher Morgan is an 24 y.o., male MRN:  [id number] DOB:  12/09/1995 Patient phone:  [phone] (home)  Patient address:   [address] [place] [id number],  Total Time spent with patient: 30 minutes  Date of Admission:  05/17/2020 Date of Discharge: 05/29/2020  Reason for Admission:  Acute psychosis and paranoia that others were trying to kill him.   Principal Problem: Brief psychotic disorder [facility](HCC) Discharge Diagnoses: Principal Problem:   Brief psychotic disorder (HCC) Active Problems:   HIV positive (HCC)   History of syphilis   Past Psychiatric History: Mother reports that as a child he had some odd behavior and some school refusal at times. Patient had no reported memory of it. No clear diagnosis. As an adult appears to have had no mental health issues reported  Past Medical History: History reviewed. No pertinent past medical history. History reviewed. No pertinent surgical history. Family History: History reviewed. No pertinent family history. Family Psychiatric  History: None reported Social History:  Social History   Substance and Sexual Activity  Alcohol Use No     Social History   Substance and Sexual Activity  Drug Use No    Social History   Socioeconomic History  . Marital status: Single    Spouse name: Not on file  . Number of children: Not on file  . Years of education: Not on file  . Highest education level: Not on file  Occupational History  . Not on file  Tobacco Use  . Smoking status: Current Every Day Smoker    Packs/day: 0.03    Types: Cigarettes  . Smokeless tobacco: Never Used  Substance and Sexual Activity  . Alcohol use: No  . Drug use: No  . Sexual activity: Not on file  Other Topics Concern  . Not on file  Social History Narrative  . Not on file   Social Determinants of Health   Financial Resource Strain:   . Difficulty of Paying Living Expenses: Not on file  Food Insecurity:    . Worried About Programme researcher, broadcasting/film/videounning Out of Food in the Last Year: Not on file  . Ran Out of Food in the Last Year: Not on file  Transportation Needs:   . Lack of Transportation (Medical): Not on file  . Lack of Transportation (Non-Medical): Not on file  Physical Activity:   . Days of Exercise per Week: Not on file  . Minutes of Exercise per Session: Not on file  Stress:   . Feeling of Stress : Not on file  Social Connections:   . Frequency of Communication with Friends and Family: Not on file  . Frequency of Social Gatherings with Friends and Family: Not on file  . Attends Religious Services: Not on file  . Active Member of Clubs or Organizations: Not on file  . Attends BankerClub or Organization Meetings: Not on file  . Marital Status: Not on file    Hospital Course:  Patient admitted for acute psychosis and feelings that others were trying to kill him. On admission there appeared to be a very strong mood component with elevated mood, flirtatiousness, and labile affect. Initially Abilify was started and titrated to 20 mg without effect. This was changed to Risperdal 1 mg in the morning, and 3 mg in the evening also without adequate effect. Finally Depakote 500 mg BID was added to Risperdal, and mood seemed to stabilize. Depakote level 93 on 05/29/20. Diagnosis remains brief psychotic disorder at this  time. Symptoms started the day of admission Oct 27th, and patient and family deny prior history of depression, mania, or psychosis. The differential remains wide at this time. There is a possibility of substance induded psychosis and mood disorder as patient did report smoking a new batch of mariajuana the day of admission. Differential also includes bipolar I disorder with psychosis given response to Depakote and large mood component on presentation. Diagnosis of schizoaffective disorder, bipolar type and schizophrenia cannot be excluded at this time. While in the hospital patient did require a physical hold and IM  injection of Zyprexa after attempting to elope from the unit by breaking down door and window. At first, patient felt everyone in the hospital was attempting to kill him via poisoning. However, by end of the hospital stay patient no longer endorsed paranoia or beliefs that people were trying to hurt him. He denied suicidal ideations, homicidal ideations, visual hallucinations, and auditory hallucinations. At time of discharge patient and mother felt he was back to his baseline. Patient, mother, and treatment team agreed he was safe for discharge with close outpatient follow-up.   During this admission it was also found that patient had been HIV and syphilis positive for 2 years prior to admission. He was given penicillin IM injections on Nov 3 and Nov 8, with final dose schedule Nov 18th. He was also started on Biktarvy for treatment of HIV, and will follow-up with [facility] on Dec 9th.   Physical Findings: AIMS: Facial and Oral Movements Muscles of Facial Expression: None, normal Lips and Perioral Area: None, normal Jaw: None, normal Tongue: None, normal,Extremity Movements Upper (arms, wrists, hands, fingers): None, normal Lower (legs, knees, ankles, toes): None, normal, Trunk Movements Neck, shoulders, hips: None, normal, Overall Severity Severity of abnormal movements (highest score from questions above): None, normal Incapacitation due to abnormal movements: None, normal Patient's awareness of abnormal movements (rate only patient's report): No Awareness, Dental Status Current problems with teeth and/or dentures?: No Does patient usually wear dentures?: No  CIWA:    COWS:     Musculoskeletal: Strength & Muscle Tone: within normal limits Gait & Station: normal Patient leans: N/A  Psychiatric Specialty Exam: Physical Exam Vitals and nursing note reviewed.  Constitutional:      Appearance: Normal appearance.  HENT:     Head: Normocephalic and atraumatic.     Right  Ear: External ear normal.     Left Ear: External ear normal.     Nose: Nose normal.     Mouth/Throat:     Mouth: Mucous membranes are moist.     Pharynx: Oropharynx is clear.  Eyes:     Extraocular Movements: Extraocular movements intact.     Conjunctiva/sclera: Conjunctivae normal.     Pupils: Pupils are equal, round, and reactive to light.  Cardiovascular:     Rate and Rhythm: Normal rate.     Pulses: Normal pulses.  Pulmonary:     Effort: Pulmonary effort is normal.     Breath sounds: Normal breath sounds.  Abdominal:     General: Abdomen is flat.     Palpations: Abdomen is soft.  Musculoskeletal:        General: No swelling. Normal range of motion.     Cervical back: Normal range of motion and neck supple.  Skin:    General: Skin is warm and dry.  Neurological:     General: No focal deficit present.     Mental Status: He is alert and oriented to  person, place, and time.  Psychiatric:        Mood and Affect: Mood normal.        Behavior: Behavior normal.        Thought Content: Thought content normal.        Judgment: Judgment normal.     Review of Systems  Constitutional: Positive for fatigue. Negative for appetite change.  HENT: Negative for rhinorrhea and sore throat.   Eyes: Negative for photophobia and visual disturbance.  Respiratory: Negative for cough and shortness of breath.   Cardiovascular: Negative for chest pain and palpitations.  Gastrointestinal: Negative for constipation, diarrhea, nausea and vomiting.  Endocrine: Negative for cold intolerance and heat intolerance.  Genitourinary: Negative for difficulty urinating and dysuria.  Musculoskeletal: Negative for arthralgias and myalgias.  Skin: Negative for rash and wound.  Allergic/Immunologic: Negative for environmental allergies and food allergies.  Neurological: Negative for dizziness and headaches.  Hematological: Negative for adenopathy. Does not bruise/bleed easily.  Psychiatric/Behavioral:  Negative for dysphoric mood, hallucinations and suicidal ideas. The patient is not nervous/anxious.    Blood pressure 121/86, pulse 74, temperature 97.6 F (36.4 C), temperature source Oral, resp. rate 18, height 5\' 7"  (1.702 m), weight 58.1 kg, SpO2 100 %.Body mass index is 20.05 kg/m.  General Appearance: Casual and Fairly Groomed  ::  Good  Speech:  Clear and Coherent  Volume:  Normal  Mood:  Euthymic  Affect:  Appropriate and Congruent  Thought Process:  Coherent  Orientation:  Full (Time, Place, and Person)  Thought Content:  Logical  Suicidal Thoughts:  No  Homicidal Thoughts:  No  Memory:  Immediate;   Fair Recent;   Fair Remote;   Fair  Judgement:  Intact  Insight:  Fair  Psychomotor Activity:  Normal  Concentration:  Fair  Recall:  [id number] of Knowledge:Fair  Language: Fair  Akathisia:  Negative  Handed:  Right  AIMS (if indicated):     Assets:  Communication Skills Desire for Improvement Financial Resources/Insurance Housing Resilience Social Support Talents/Skills Transportation Vocational/Educational  Sleep:  Number of Hours: 8  Cognition: WNL  ADL's:  Intact           Has this patient used any form of tobacco in the last 30 days? (Cigarettes, Smokeless Tobacco, Cigars, and/or Pipes) No   Blood Alcohol level:  Lab Results  Component Value Date   ETH <10 05/16/2020    Metabolic Disorder Labs:  Lab Results  Component Value Date   HGBA1C 5.0 05/18/2020   MPG 97 05/18/2020   No results found for: PROLACTIN Lab Results  Component Value Date   CHOL 131 05/18/2020   TRIG 105 05/18/2020   HDL 51 05/18/2020   CHOLHDL 2.6 05/18/2020   VLDL 21 05/18/2020   LDLCALC 59 05/18/2020    See Psychiatric Specialty Exam and Suicide Risk Assessment completed by Attending Physician prior to discharge.  Discharge destination:  Home  Is patient on multiple antipsychotic therapies at discharge:  No   Has Patient had three or more failed  trials of antipsychotic monotherapy by history:  No  Recommended Plan for Multiple Antipsychotic Therapies: NA  Discharge Instructions    Diet general   Complete by: As directed    Increase activity slowly   Complete by: As directed    Increase activity slowly   Complete by: As directed      Allergies as of 05/29/2020   No Known Allergies     Medication List  STOP taking these medications   azithromycin 250 MG tablet Commonly known as: Zithromax Z-Pak   brompheniramine-pseudoephedrine-DM 30-2-10 MG/5ML syrup   cephALEXin 500 MG capsule Commonly known as: KEFLEX   HYDROcodone-acetaminophen 5-325 MG tablet Commonly known as: Norco   ibuprofen 600 MG tablet Commonly known as: ADVIL   traMADol 50 MG tablet Commonly known as: Ultram     TAKE these medications     Indication  bictegravir-emtricitabine-tenofovir AF 50-200-25 MG Tabs tablet Commonly known as: BIKTARVY Take 1 tablet by mouth daily.  Indication: HIV Disease   divalproex 500 MG DR tablet Commonly known as: DEPAKOTE Take 1 tablet (500 mg total) by mouth every 12 (twelve) hours.  Indication: Manic Phase of Manic-Depression   risperiDONE 2 MG tablet Commonly known as: RISPERDAL Take 1 tablet (2 mg total) by mouth 2 (two) times daily.  Indication: Manic Phase of Manic-Depression   traZODone 100 MG tablet Commonly known as: DESYREL Take 1 tablet (100 mg total) by mouth at bedtime as needed for sleep.  Indication: Trouble Sleeping       Follow-up Information    [facility] Follow up.   Why: Your appointment is scheduled for 06/05/2020 with Lorella Nimrod, Peer Support Specialist at 7:30Am via Zoom.  Thanks! Contact information: [address] [place] [address] [phone]               Follow-up recommendations:  Activity:  as tolerated Diet:  regular diet  Comments:  30-day free supply of Biktarvy given to patient. 7-day free supply of psychiatric medication provided  along with 30-day printed script with 1 refill. Patient received Penicillin IM injections on Nov 3rd and Nov 8th during this admission. Final injection due on 06/07/20. He has a follow-up at [facility] for HIV care on 06/28/20.   Signed: [name], MD 05/29/2020, 9:32 AM

## 2020-05-29 NOTE — Progress Notes (Signed)
Recreation Therapy Notes  INPATIENT RECREATION TR PLAN  Patient Details Name: Christopher Morgan MRN: [id number] DOB: June 25, 1996 Today's Date: 05/29/2020  Rec Therapy Plan Is patient appropriate for Therapeutic Recreation?: Yes Treatment times per week: at least 3 Estimated Length of Stay: 5-7 days TR Treatment/Interventions: Group participation (Comment)  Discharge Criteria Pt will be discharged from therapy if:: Discharged Treatment plan/goals/alternatives discussed and agreed upon by:: Patient/family  Discharge Summary Short term goals set: Patient will engage in groups without prompting or encouragement from LRT x3 group sessions within 5 recreation therapy group sessions Short term goals met: Complete Progress toward goals comments: Groups attended Which groups?: Leisure education, Goal setting, Other (Comment) (Problem Solving, Necessities) Reason goals not met: N/A Therapeutic equipment acquired: N/A Reason patient discharged from therapy: Discharge from hospital Pt/family agrees with progress & goals achieved: Yes Date patient discharged from therapy: 05/29/20   Saniya Tranchina 05/29/2020, 11:29 AM

## 2020-05-29 NOTE — Progress Notes (Addendum)
Pharmacy - Antimicrobial Stewardship  Discussed case with Laurette Schimke from [facility].  His Medicaid is no longer active and she has applied for HMAP for patient (approval pending).  In the meantime, will get free 30d supply via [employer]/product designer).  The Transitions of care pharmacy technician has applied for this patient assistance and received approval.  I have obtained the drug from [facility] and will coordinate with discharge to ensure patient gets medication prior to discharge.   Addendum:  Medication given to RN prior to discharge to give to patient to take home   Juliette Alcide, PharmD, BCPS.   Work Cell: [phone] 05/29/2020 8:03 AM

## 2020-05-31 ENCOUNTER — Ambulatory Visit: Payer: Self-pay | Admitting: Infectious Diseases

## 2020-05-31 ENCOUNTER — Telehealth: Payer: Self-pay

## 2020-05-31 NOTE — Telephone Encounter (Signed)
[facility] Patient Advocate Encounter  Completed and sent [employer] Advancing Access application for Biktarvy for this patient who is uninsured.    Patient is approved 05/28/20 through 07/20/20.  BIN      [id number] PCN    [id number] GRP    [id number] ID        [id number]   Clearance Coots, CPhT Specialty Pharmacy Patient [facility] Phone: [phone] Fax:  [phone]

## 2020-06-01 LAB — GENOSURE PRIME (GSPRIL)

## 2020-06-07 ENCOUNTER — Ambulatory Visit: Payer: Self-pay | Attending: Infectious Diseases | Admitting: Infectious Diseases

## 2020-06-07 ENCOUNTER — Encounter: Payer: Self-pay | Admitting: Infectious Diseases

## 2020-06-07 VITALS — BP 114/77 | HR 101 | Resp 16 | Ht 67.0 in | Wt 146.0 lb

## 2020-06-07 DIAGNOSIS — F23 Brief psychotic disorder: Secondary | ICD-10-CM | POA: Diagnosis not present

## 2020-06-07 DIAGNOSIS — B20 Human immunodeficiency virus [HIV] disease: Secondary | ICD-10-CM | POA: Diagnosis not present

## 2020-06-07 DIAGNOSIS — F17210 Nicotine dependence, cigarettes, uncomplicated: Secondary | ICD-10-CM | POA: Insufficient documentation

## 2020-06-07 DIAGNOSIS — A530 Latent syphilis, unspecified as early or late: Secondary | ICD-10-CM

## 2020-06-07 DIAGNOSIS — Z79899 Other long term (current) drug therapy: Secondary | ICD-10-CM | POA: Diagnosis not present

## 2020-06-07 DIAGNOSIS — Z8619 Personal history of other infectious and parasitic diseases: Secondary | ICD-10-CM | POA: Diagnosis not present

## 2020-06-07 MED ORDER — PENICILLIN G BENZATHINE 1200000 UNIT/2ML IM SUSP
2.4000 10*6.[IU] | Freq: Once | INTRAMUSCULAR | Status: AC
  Administered 2020-06-07: 2.4 10*6.[IU] via INTRAMUSCULAR

## 2020-06-07 MED ORDER — BICTEGRAVIR-EMTRICITAB-TENOFOV 50-200-25 MG PO TABS: 1.0000 | ORAL_TABLET | Freq: Every day | ORAL | 3 refills | Status: AC

## 2020-06-07 NOTE — Patient Instructions (Addendum)
You are here for getting your 3rd dose of penicillin im. You also have HIV and you started recently on Biktarvy and you are doing well- You have your first appt with [facility] on 06/28/20- you will need to get covid vaccine, hepb vaccine, and other tests. Will see you back in 2 weeks

## 2020-06-07 NOTE — Progress Notes (Signed)
NAME: Christopher Morgan  DOB: March 02, 1996  MRN: [id number]  Date/Time: 06/07/2020 12:42 PM   Subjective:   ? Christopher Morgan is a 24 y.o. male with h/o HIV diagnosed in 2019, was not on any treatment until his recent hospitalization at [facility] behavioral health 10/28-11/9/21  for acute psychosis and paranoia, latent syphilis HIV diagnosed 2019 Nadir Cd4 635 VL 1450 OI -none HAARt history- started on Biktarvy Nov 2021 Acquired thru sex with men Genotype 05/18/20 -no resistance ? Got treated for syphilis in the hospital- got 2 doses a week apart Here for his third dose Pt doing well- 100% adherent to [employer] he is eating more and has gained a few pounds He says he wants to take car of himself Has gone back to working in [employer] Not sexually active in many months No partners Lives with mom PSH-none   Social History   Socioeconomic History  . Marital status: Single    Spouse name: Not on file  . Number of children: Not on file  . Years of education: Not on file  . Highest education level: Not on file  Occupational History  . Not on file  Tobacco Use  . Smoking status: Current Every Day Smoker    Packs/day: 0.03    Types: Cigarettes  . Smokeless tobacco: Never Used  Substance and Sexual Activity  . Alcohol use: No  . Drug use: No  . Sexual activity: Not on file  Other Topics Concern  . Not on file  Social History Narrative  . Not on file   Social Determinants of Health   Financial Resource Strain:   . Difficulty of Paying Living Expenses: Not on file  Food Insecurity:   . Worried About Programme researcher, broadcasting/film/video in the Last Year: Not on file  . Ran Out of Food in the Last Year: Not on file  Transportation Needs:   . Lack of Transportation (Medical): Not on file  . Lack of Transportation (Non-Medical): Not on file  Physical Activity:   . Days of Exercise per Week: Not on file  . Minutes of Exercise per Session: Not on file  Stress:   . Feeling of Stress : Not on file   Social Connections:   . Frequency of Communication with Friends and Family: Not on file  . Frequency of Social Gatherings with Friends and Family: Not on file  . Attends Religious Services: Not on file  . Active Member of Clubs or Organizations: Not on file  . Attends Banker Meetings: Not on file  . Marital Status: Not on file  Intimate Partner Violence:   . Fear of Current or Ex-Partner: Not on file  . Emotionally Abused: Not on file  . Physically Abused: Not on file  . Sexually Abused: Not on file    Family History-pt not aware No Known Allergies ? Current Outpatient Medications  Medication Sig Dispense Refill  . bictegravir-emtricitabine-tenofovir AF (BIKTARVY) 50-200-25 MG TABS tablet Take 1 tablet by mouth daily. 30 tablet 0  . divalproex (DEPAKOTE) 500 MG DR tablet Take 1 tablet (500 mg total) by mouth every 12 (twelve) hours. 60 tablet 1  . risperiDONE (RISPERDAL) 2 MG tablet Take 1 tablet (2 mg total) by mouth 2 (two) times daily. 60 tablet 1  . traZODone (DESYREL) 100 MG tablet Take 1 tablet (100 mg total) by mouth at bedtime as needed for sleep. 30 tablet 1   No current facility-administered medications for this visit.    REVIEW OF  SYSTEMS:  Const: negative fever, negative chills, negative weight loss Eyes: negative diplopia or visual changes, negative eye pain ENT: negative coryza, negative sore throat Resp: negative cough, hemoptysis, dyspnea Cards: negative for chest pain, palpitations, lower extremity edema GU: negative for frequency, dysuria and hematuria Skin: negative for rash and pruritus Heme: negative for easy bruising and gum/nose bleeding MS: negative for myalgias, arthralgias, back pain and muscle weakness Neurolo:negative for headaches, dizziness, vertigo, memory problems  Psych:  Acute psychosis better  : Objective:  VITALS:  BP 114/77   Pulse (!) 101   Resp 16   Ht 5\' 7"  (1.702 m)   Wt 146 lb (66.2 kg)   SpO2 96%   BMI 22.87  kg/m  PHYSICAL EXAM:  General: Alert, cooperative, no distress, appears stated age.  Head: Normocephalic, without obvious abnormality, atraumatic. Eyes: Conjunctivae clear, anicteric sclerae. Pupils are equal Nose: Nares normal. No drainage or sinus tenderness. Throat: Lips, mucosa, and tongue normal. No Thrush Neck: Supple, symmetrical, no adenopathy, thyroid: non tender no carotid bruit and no JVD. Back: No CVA tenderness. Lungs: Clear to auscultation bilaterally. No Wheezing or Rhonchi. No rales. Heart: Regular rate and rhythm, no murmur, rub or gallop. Abdomen: Soft, non-tender,not distended. Bowel sounds normal. No masses Extremities: Extremities normal, atraumatic, no cyanosis. No edema. No clubbing Skin: No rashes or lesions. Not Jaundiced Lymph: Cervical, supraclavicular normal. Neurologic: Grossly non-focal  Health maintenance Vaccination  Vaccine Date last given comment  Influenza 05/18/20   Hepatitis B    Hepatitis A    Prevnar-PCV-13    Pneumovac-PPSV-23 05/18/20   TdaP    HPV    Shingrix ( zoster vaccine)     ______________________  Labs Lab Result  Date comment  HIV VL 1450 05/18/20   CD4 635 05/18/20   Genotype Neg 05/18/20   [id number]     HIV antibody     RPR reactive  3 doses of penicllin11/3, 11/9 and 06/07/20  Quantiferon Gold     Hep C ab NR 05/16/20   Hepatitis B-ab,ag,c NR    Hepatitis A-IgM, IgG /T NR    Lipid     GC/CHL     PAP     HB,PLT,Cr, LFT       Preventive  Procedure Result  Date comment  colonoscopy          Dental exam     Opthal       Impression/Recommendation ?HIV- on Biktarvy- last Vl  1430 and cd4 is 633 Started treatment Nov 1st week while in hospital PT has no insurance and HMAP has been approved and he will follow with Nov 3 center ? 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

## 2020-06-12 ENCOUNTER — Ambulatory Visit: Payer: Self-pay

## 2020-06-26 ENCOUNTER — Ambulatory Visit: Payer: Self-pay

## 2020-06-26 ENCOUNTER — Ambulatory Visit: Payer: Self-pay | Admitting: Infectious Diseases
